# Patient Record
Sex: Male | Born: 1995
Health system: Southern US, Community
[De-identification: ages and names within clinical notes are randomized; demographics above are authoritative.]

## PROBLEM LIST (undated history)

## (undated) DIAGNOSIS — N200 Calculus of kidney: Secondary | ICD-10-CM

## (undated) DIAGNOSIS — K760 Fatty (change of) liver, not elsewhere classified: Secondary | ICD-10-CM

## (undated) DIAGNOSIS — J45909 Unspecified asthma, uncomplicated: Secondary | ICD-10-CM

## (undated) DIAGNOSIS — F329 Major depressive disorder, single episode, unspecified: Secondary | ICD-10-CM

## (undated) DIAGNOSIS — F419 Anxiety disorder, unspecified: Secondary | ICD-10-CM

## (undated) DIAGNOSIS — Z87442 Personal history of urinary calculi: Secondary | ICD-10-CM

## (undated) DIAGNOSIS — F32A Depression, unspecified: Secondary | ICD-10-CM

## (undated) DIAGNOSIS — F1010 Alcohol abuse, uncomplicated: Secondary | ICD-10-CM

## (undated) DIAGNOSIS — F603 Borderline personality disorder: Secondary | ICD-10-CM

## (undated) HISTORY — PX: WISDOM TOOTH EXTRACTION: SHX21

## (undated) HISTORY — PX: KIDNEY STONE SURGERY: SHX686

---
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
## 2016-01-13 DIAGNOSIS — J069 Acute upper respiratory infection, unspecified: Secondary | ICD-10-CM | POA: Diagnosis not present

## 2016-02-16 DIAGNOSIS — L700 Acne vulgaris: Secondary | ICD-10-CM | POA: Diagnosis not present

## 2016-02-16 DIAGNOSIS — L02214 Cutaneous abscess of groin: Secondary | ICD-10-CM | POA: Diagnosis not present

## 2016-04-05 DIAGNOSIS — F603 Borderline personality disorder: Secondary | ICD-10-CM | POA: Diagnosis not present

## 2016-04-05 DIAGNOSIS — F331 Major depressive disorder, recurrent, moderate: Secondary | ICD-10-CM | POA: Diagnosis not present

## 2016-04-18 DIAGNOSIS — F603 Borderline personality disorder: Secondary | ICD-10-CM | POA: Diagnosis not present

## 2016-05-02 DIAGNOSIS — F603 Borderline personality disorder: Secondary | ICD-10-CM | POA: Diagnosis not present

## 2016-05-09 DIAGNOSIS — F603 Borderline personality disorder: Secondary | ICD-10-CM | POA: Diagnosis not present

## 2016-05-16 DIAGNOSIS — F603 Borderline personality disorder: Secondary | ICD-10-CM | POA: Diagnosis not present

## 2016-05-31 DIAGNOSIS — F603 Borderline personality disorder: Secondary | ICD-10-CM | POA: Diagnosis not present

## 2016-06-06 DIAGNOSIS — F603 Borderline personality disorder: Secondary | ICD-10-CM | POA: Diagnosis not present

## 2016-06-13 DIAGNOSIS — F603 Borderline personality disorder: Secondary | ICD-10-CM | POA: Diagnosis not present

## 2016-06-20 DIAGNOSIS — H1033 Unspecified acute conjunctivitis, bilateral: Secondary | ICD-10-CM | POA: Diagnosis not present

## 2016-06-24 DIAGNOSIS — F329 Major depressive disorder, single episode, unspecified: Secondary | ICD-10-CM | POA: Diagnosis not present

## 2016-06-27 DIAGNOSIS — F329 Major depressive disorder, single episode, unspecified: Secondary | ICD-10-CM | POA: Diagnosis not present

## 2016-07-04 DIAGNOSIS — F329 Major depressive disorder, single episode, unspecified: Secondary | ICD-10-CM | POA: Diagnosis not present

## 2016-07-18 DIAGNOSIS — F329 Major depressive disorder, single episode, unspecified: Secondary | ICD-10-CM | POA: Diagnosis not present

## 2016-07-22 DIAGNOSIS — J029 Acute pharyngitis, unspecified: Secondary | ICD-10-CM | POA: Diagnosis not present

## 2016-07-22 DIAGNOSIS — Z209 Contact with and (suspected) exposure to unspecified communicable disease: Secondary | ICD-10-CM | POA: Diagnosis not present

## 2016-07-25 DIAGNOSIS — F329 Major depressive disorder, single episode, unspecified: Secondary | ICD-10-CM | POA: Diagnosis not present

## 2016-07-26 DIAGNOSIS — F329 Major depressive disorder, single episode, unspecified: Secondary | ICD-10-CM | POA: Diagnosis not present

## 2016-08-05 DIAGNOSIS — F329 Major depressive disorder, single episode, unspecified: Secondary | ICD-10-CM | POA: Diagnosis not present

## 2016-08-08 DIAGNOSIS — F329 Major depressive disorder, single episode, unspecified: Secondary | ICD-10-CM | POA: Diagnosis not present

## 2016-08-15 DIAGNOSIS — F329 Major depressive disorder, single episode, unspecified: Secondary | ICD-10-CM | POA: Diagnosis not present

## 2016-08-22 DIAGNOSIS — F329 Major depressive disorder, single episode, unspecified: Secondary | ICD-10-CM | POA: Diagnosis not present

## 2016-08-29 DIAGNOSIS — F329 Major depressive disorder, single episode, unspecified: Secondary | ICD-10-CM | POA: Diagnosis not present

## 2016-09-02 DIAGNOSIS — F329 Major depressive disorder, single episode, unspecified: Secondary | ICD-10-CM | POA: Diagnosis not present

## 2017-01-18 DIAGNOSIS — J069 Acute upper respiratory infection, unspecified: Secondary | ICD-10-CM | POA: Diagnosis not present

## 2017-02-06 DIAGNOSIS — J069 Acute upper respiratory infection, unspecified: Secondary | ICD-10-CM | POA: Diagnosis not present

## 2017-02-06 DIAGNOSIS — R05 Cough: Secondary | ICD-10-CM | POA: Diagnosis not present

## 2017-03-13 DIAGNOSIS — B349 Viral infection, unspecified: Secondary | ICD-10-CM | POA: Diagnosis not present

## 2017-03-13 DIAGNOSIS — R509 Fever, unspecified: Secondary | ICD-10-CM | POA: Diagnosis not present

## 2017-06-13 DIAGNOSIS — J0140 Acute pansinusitis, unspecified: Secondary | ICD-10-CM | POA: Diagnosis not present

## 2017-07-26 DIAGNOSIS — N201 Calculus of ureter: Secondary | ICD-10-CM | POA: Diagnosis not present

## 2017-07-26 DIAGNOSIS — N132 Hydronephrosis with renal and ureteral calculous obstruction: Secondary | ICD-10-CM | POA: Diagnosis not present

## 2017-07-26 DIAGNOSIS — R310 Gross hematuria: Secondary | ICD-10-CM | POA: Diagnosis not present

## 2017-07-28 ENCOUNTER — Other Ambulatory Visit: Payer: Self-pay

## 2017-07-28 ENCOUNTER — Emergency Department (HOSPITAL_BASED_OUTPATIENT_CLINIC_OR_DEPARTMENT_OTHER)
Admission: EM | Admit: 2017-07-28 | Discharge: 2017-07-28 | Disposition: A | Discharge status: Home / Self Care | Payer: BLUE CROSS/BLUE SHIELD | Attending: Emergency Medicine | Admitting: Emergency Medicine

## 2017-07-28 ENCOUNTER — Encounter (HOSPITAL_BASED_OUTPATIENT_CLINIC_OR_DEPARTMENT_OTHER): Payer: Self-pay | Admitting: Emergency Medicine

## 2017-07-28 ENCOUNTER — Emergency Department (HOSPITAL_BASED_OUTPATIENT_CLINIC_OR_DEPARTMENT_OTHER): Payer: BLUE CROSS/BLUE SHIELD

## 2017-07-28 DIAGNOSIS — J45909 Unspecified asthma, uncomplicated: Secondary | ICD-10-CM | POA: Diagnosis not present

## 2017-07-28 DIAGNOSIS — N23 Unspecified renal colic: Secondary | ICD-10-CM | POA: Diagnosis not present

## 2017-07-28 DIAGNOSIS — Z79899 Other long term (current) drug therapy: Secondary | ICD-10-CM | POA: Insufficient documentation

## 2017-07-28 DIAGNOSIS — Z87891 Personal history of nicotine dependence: Secondary | ICD-10-CM | POA: Diagnosis not present

## 2017-07-28 DIAGNOSIS — R109 Unspecified abdominal pain: Secondary | ICD-10-CM | POA: Diagnosis not present

## 2017-07-28 DIAGNOSIS — N202 Calculus of kidney with calculus of ureter: Secondary | ICD-10-CM | POA: Diagnosis not present

## 2017-07-28 DIAGNOSIS — Z87442 Personal history of urinary calculi: Secondary | ICD-10-CM | POA: Diagnosis not present

## 2017-07-28 LAB — BASIC METABOLIC PANEL
Anion gap: 12 (ref 5–15)
BUN: 8 mg/dL (ref 6–20)
CO2: 23 mmol/L (ref 22–32)
Calcium: 9.4 mg/dL (ref 8.9–10.3)
Chloride: 104 mmol/L (ref 98–111)
Creatinine, Ser: 1.07 mg/dL (ref 0.61–1.24)
GFR calc Af Amer: >60 mL/min (ref >60)
GFR calc non Af Amer: >60 mL/min (ref >60)
Glucose, Bld: 100 mg/dL — ABNORMAL HIGH (ref 70–99)
Potassium: 3.8 mmol/L (ref 3.5–5.1)
Sodium: 139 mmol/L (ref 135–145)

## 2017-07-28 LAB — URINALYSIS, MICROSCOPIC (REFLEX): RBC / HPF: >50 RBC/hpf (ref 0–5)

## 2017-07-28 LAB — CBC
HCT: 39.5 % (ref 39.0–52.0)
Hemoglobin: 13.6 g/dL (ref 13.0–17.0)
MCH: 30.4 pg (ref 26.0–34.0)
MCHC: 34.4 g/dL (ref 30.0–36.0)
MCV: 88.2 fL (ref 78.0–100.0)
Platelets: 283 10*3/uL (ref 150–400)
RBC: 4.48 MIL/uL (ref 4.22–5.81)
RDW: 12.3 % (ref 11.5–15.5)
WBC: 14.7 10*3/uL — ABNORMAL HIGH (ref 4.0–10.5)

## 2017-07-28 LAB — URINALYSIS, ROUTINE W REFLEX MICROSCOPIC
BILIRUBIN URINE: NEGATIVE
GLUCOSE, UA: NEGATIVE mg/dL
KETONES UR: NEGATIVE mg/dL
Leukocytes, UA: NEGATIVE
NITRITE: NEGATIVE
Protein, ur: 30 mg/dL — AB
SPECIFIC GRAVITY, URINE: 1.015 (ref 1.005–1.030)
pH: 8 (ref 5.0–8.0)

## 2017-07-28 MED ORDER — HYDROMORPHONE HCL 1 MG/ML IJ SOLN
1.0000 mg | Freq: Once | INTRAMUSCULAR | Status: AC
  Administered 2017-07-28: 1 mg via INTRAVENOUS
  Filled 2017-07-28: qty 1

## 2017-07-28 MED ORDER — KETOROLAC TROMETHAMINE 10 MG PO TABS: 10.0000 mg | ORAL_TABLET | Freq: Four times a day (QID) | ORAL | 0 refills | Status: AC | PRN

## 2017-07-28 MED ORDER — KETOROLAC TROMETHAMINE 30 MG/ML IJ SOLN
30.0000 mg | Freq: Once | INTRAMUSCULAR | Status: AC
  Administered 2017-07-28: 30 mg via INTRAVENOUS
  Filled 2017-07-28: qty 1

## 2017-07-28 MED ORDER — ONDANSETRON HCL 4 MG/2ML IJ SOLN
4.0000 mg | Freq: Once | INTRAMUSCULAR | Status: AC
  Administered 2017-07-28: 4 mg via INTRAVENOUS
  Filled 2017-07-28: qty 2

## 2017-07-28 MED ORDER — OXYCODONE HCL 10 MG PO TABS: 5.0000 mg | ORAL_TABLET | Freq: Four times a day (QID) | ORAL | 0 refills | Status: DC | PRN

## 2017-07-28 MED ORDER — HYDROMORPHONE HCL 4 MG PO TABS
4.0000 mg | ORAL_TABLET | Freq: Once | ORAL | Status: DC
  Filled 2017-07-28: qty 1

## 2017-07-28 MED ORDER — OXYCODONE HCL 5 MG PO TABS
10.0000 mg | ORAL_TABLET | Freq: Once | ORAL | Status: AC
  Administered 2017-07-28: 10 mg via ORAL
  Filled 2017-07-28: qty 2

## 2017-07-28 MED ORDER — ONDANSETRON HCL 4 MG PO TABS: 4.0000 mg | ORAL_TABLET | Freq: Three times a day (TID) | ORAL | 0 refills | Status: AC | PRN

## 2017-07-28 NOTE — ED Provider Notes (Signed)
[facility] EMERGENCY DEPARTMENT Provider Note   CSN: [id number] Arrival date & time: 07/28/17  1711     History   Chief Complaint Chief Complaint  Patient presents with  . Flank Pain    HPI Stanley Henry is a 22 y.o. male who presents the emergency department chief complaint of right flank pain.  He has a past medical history of recurrent kidney stones and was diagnosed with a right sided 4.5 x 2.5 mm kidney [place] in the proximal right ureter yesterday by his urologist.  He was discharged home with Percocet.  The patient states that his pain became severe again this morning around 11 and is radiating into his right groin.  He took his pain medication without relief.  He rates his pain at 10 out of 10.   Other is present at bedside and states that "we tried to tough it out together but he is miserable."  He denies fever or chills.  He has been able to urinate.  He denies hematuria.  He is scheduled for lithotripsy on Monday, 07/31/2017.  HPI  Past Medical History:  Diagnosis Date  . Anxiety   . Asthma   . Depression   . Fatty liver   . History of kidney stones   . Kidney [place]     Patient Active Problem List   Diagnosis Date Noted  . MDD (major depressive disorder), recurrent episode, moderate (HCC) 02/18/2013  . GAD (generalized anxiety disorder) 06/04/2012    Past Surgical History:  Procedure Laterality Date  . KIDNEY [place] SURGERY    . WISDOM TOOTH EXTRACTION          Home Medications    Prior to Admission medications   Medication Sig Start Date End Date Taking? Authorizing Provider  albuterol (PROVENTIL HFA;VENTOLIN HFA) 108 (90 BASE) MCG/ACT inhaler Inhale 2 puffs into the lungs every 6 (six) hours as needed for shortness of breath. Patient may resume home supply. 02/20/13   Winson, Louie Bun, NP  citalopram (CELEXA) 20 MG tablet Take 1 tablet (20 mg total) by mouth daily. 02/20/13   Winson, Louie Bun, NP  emtricitabine-tenofovir (TRUVADA) 200-300 MG tablet  Take 1 tablet by mouth daily.    [provider]  fexofenadine (ALLEGRA) 180 MG tablet Take 1 tablet (180 mg total) by mouth every morning. Patient may resume home supply. 02/20/13   Winson, Louie Bun, NP  ibuprofen (ADVIL,MOTRIN) 400 MG tablet Take 1 tablet (400 mg total) by mouth every 8 (eight) hours as needed. 12/01/15   Azalia Bilis, MD  ISOtretinoin (ACCUTANE) 30 MG capsule Take 30 mg by mouth 2 (two) times daily.    [provider]  lamoTRIgine (LAMICTAL) 100 MG tablet Take 1 tablet (100 mg total) by mouth daily. 02/20/13   Winson, Louie Bun, NP  ondansetron (ZOFRAN ODT) 8 MG disintegrating tablet Take 1 tablet (8 mg total) by mouth every 8 (eight) hours as needed for nausea or vomiting. 12/01/15   Azalia Bilis, MD  phenazopyridine (PYRIDIUM) 200 MG tablet Take 1 tablet (200 mg total) by mouth 3 (three) times daily as needed for pain. 12/10/15   Crist Fat, MD  risperiDONE (RISPERDAL) 0.5 MG tablet Take 1 tablet (0.5 mg total) by mouth 2 (two) times daily. 02/20/13   Winson, Louie Bun, NP  tamsulosin (FLOMAX) 0.4 MG CAPS capsule Take 1 capsule (0.4 mg total) by mouth daily. 12/01/15   Azalia Bilis, MD    Family History No family history on file.  Social History Social History   Tobacco Use  . Smoking status: Former Games developer  . Smokeless tobacco: Never Used  Substance Use Topics  . Alcohol use: No  . Drug use: Yes    Types: Marijuana    Comment: last used 12/09/2015     Allergies   Phenergan [promethazine hcl] and Hydrocodone   Review of Systems Review of Systems Ten systems reviewed and are negative for acute change, except as noted in the HPI.    Physical Exam Updated Vital Signs BP (!) 137/94 (BP Location: Left Arm)   Pulse 98   Temp 98.3 F (36.8 C) (Oral)   Resp 18   Ht 5\' 9"  (1.753 m)   Wt 97.5 kg (215 lb)   SpO2 100%   BMI 31.75 kg/m   Physical Exam  Constitutional: He appears well-developed and well-nourished. No distress.  Tearful,  squirming around in the bed, unable to find a position of comfort  HENT:  Head: Normocephalic and atraumatic.  Eyes: Conjunctivae are normal. No scleral icterus.  Neck: Normal range of motion. Neck supple.  Cardiovascular: Normal rate, regular rhythm and normal heart sounds.  Pulmonary/Chest: Effort normal and breath sounds normal. No respiratory distress.  Abdominal: Soft. He exhibits no distension. There is no tenderness.  Right sided CVA tenderness  Musculoskeletal: He exhibits no edema.  Neurological: He is alert.  Skin: Skin is warm and dry. He is not diaphoretic.  Psychiatric: His behavior is normal.  Nursing note and vitals reviewed.    ED Treatments / Results  Labs (all labs ordered are listed, but only abnormal results are displayed) Labs Reviewed  BASIC METABOLIC PANEL  CBC  URINALYSIS, ROUTINE W REFLEX MICROSCOPIC    EKG None  Radiology No results found.  Procedures Procedures (including critical care time)  Medications Ordered in ED Medications  HYDROmorphone (DILAUDID) injection 1 mg (has no administration in time range)  ondansetron (ZOFRAN) injection 4 mg (has no administration in time range)     Initial Impression / Assessment and Plan / ED Course  I have reviewed the triage vital signs and the nursing notes.  Pertinent labs & imaging results that were available during my care of the patient were reviewed by me and considered in my medical decision making (see chart for details).     This is a 22 year old male with a known kidney [place] who presented with intractable pain.  Ultrasound performed and I have reviewed the images.  He appears to have some worsening hydronephrosis on the right.  [name] lab work shows elevated white blood cell count with a believe is secondary to acute phase reaction in this stressful situation.  He has large hemoglobin without evidence of infection on his urinalysis.  There is no elevation in the patient's creatinine or  suggestion of post obstructive uropathy.  Eventually we were able to control the patient's pain with oral oxycodone, Toradol.  He is currently rating his pain at 1 out of 10 and feels comfortable being discharged.  I discussed that the patient should return to [facility] emergency department should he have intractable pain or vomiting, fever or other concerning symptom.  He may otherwise follow-up as scheduled this coming Wednesday for lithotripsy.  Patient appears appropriate for discharge at this time.  Change the patient's pain medication regimen to oxycodone IR.  This will give him greater regulation over the Tylenol usage.  Patient will also be given p.o. Toradol and Zofran.  Discussed appropriate use of the narcotic medication.  Final Clinical Impressions(s) / ED Diagnoses   Final diagnoses:  Ureteral colic    ED Discharge Orders    None       Arthor CaptainHarris, Gladyse Corvin, PA-C 07/29/17 0036    Gwyneth SproutPlunkett, Whitney, MD 07/30/17 Rickey [id number]

## 2017-07-28 NOTE — Discharge Instructions (Signed)
Contact a health care provider if: You have a fever or chills. Your urine smells bad or looks cloudy. You have pain or burning when you pass urine. Get help right away if: Your flank pain or groin pain suddenly worsens. You become confused or disoriented or you lose consciousness. 

## 2017-07-28 NOTE — ED Triage Notes (Signed)
Hematuria and R flank pain since Wednesday. Seen by his urologist and told he has a kidney [place]. scheduled for lithotripsy on Monday. Given oxycodone for pain which is not working.

## 2017-08-08 DIAGNOSIS — N201 Calculus of ureter: Secondary | ICD-10-CM | POA: Diagnosis not present

## 2017-08-09 DIAGNOSIS — F329 Major depressive disorder, single episode, unspecified: Secondary | ICD-10-CM | POA: Diagnosis not present

## 2017-08-15 DIAGNOSIS — F329 Major depressive disorder, single episode, unspecified: Secondary | ICD-10-CM | POA: Diagnosis not present

## 2017-08-17 DIAGNOSIS — L700 Acne vulgaris: Secondary | ICD-10-CM | POA: Diagnosis not present

## 2017-08-17 DIAGNOSIS — B9689 Other specified bacterial agents as the cause of diseases classified elsewhere: Secondary | ICD-10-CM | POA: Diagnosis not present

## 2017-08-17 DIAGNOSIS — L02821 Furuncle of head [any part, except face]: Secondary | ICD-10-CM | POA: Diagnosis not present

## 2017-08-23 DIAGNOSIS — F329 Major depressive disorder, single episode, unspecified: Secondary | ICD-10-CM | POA: Diagnosis not present

## 2017-08-29 DIAGNOSIS — F329 Major depressive disorder, single episode, unspecified: Secondary | ICD-10-CM | POA: Diagnosis not present

## 2017-09-20 DIAGNOSIS — F329 Major depressive disorder, single episode, unspecified: Secondary | ICD-10-CM | POA: Diagnosis not present

## 2017-09-27 DIAGNOSIS — F329 Major depressive disorder, single episode, unspecified: Secondary | ICD-10-CM | POA: Diagnosis not present

## 2017-10-25 ENCOUNTER — Ambulatory Visit: Payer: Self-pay | Admitting: Psychiatry

## 2017-11-01 ENCOUNTER — Ambulatory Visit (INDEPENDENT_AMBULATORY_CARE_PROVIDER_SITE_OTHER): Payer: BLUE CROSS/BLUE SHIELD | Admitting: Psychiatry

## 2017-11-01 DIAGNOSIS — F1010 Alcohol abuse, uncomplicated: Secondary | ICD-10-CM

## 2017-11-01 DIAGNOSIS — F1210 Cannabis abuse, uncomplicated: Secondary | ICD-10-CM

## 2017-11-01 DIAGNOSIS — F331 Major depressive disorder, recurrent, moderate: Secondary | ICD-10-CM

## 2017-11-01 DIAGNOSIS — F4310 Post-traumatic stress disorder, unspecified: Secondary | ICD-10-CM

## 2017-11-01 DIAGNOSIS — F603 Borderline personality disorder: Secondary | ICD-10-CM

## 2017-11-01 NOTE — Progress Notes (Signed)
[facility] Counselor/Therapist Progress Note   Patient ID: KIMBLE HITCHENS, MRN: [id number]  Date: 11/01/2017  Start time: 11:35a     Stop time: 12:25p Time Spent: 50 min  Treatment Type: Individual  Subjective:  Apologetic for no show.  Had new flareup of negativity with Rise Mu, he apologized, but  C/o forgetfulness, admittedly can be related to pot use, and anxiety, and trauma conditioning.  Monday had violent thoughts in response to frustrations in relationship, roommates lack of household responsibilities, used THC and EtOH, vented at [place], was pissed at his client, and had violent fantasies toward self and others without intent or plan.  Irritated that Rise Mu "broke our rules" about sexual boundaries in open "play" with others, and   Still irritated with client Oswaldo Done, who manipulated him again into doing his homework for him.  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  Has a monthly music show now, succeeding better, but envious of the money Rise Mu makes doing several drag shows a week, and feeling some unfairness in their domestic relationship.  Interventions: Dialectical Behavioral Therapy, Assertiveness/Communication and Reality Therapy  Reframed communication and conflict habits of blaming, personalizing, quibbling, and other reacting as the core problems, more than personalities.  Interpreted needs to have collaborative control over reactionary process, establish fair fighting standards, and put more agreed-upon effort into problem-solving, "customer service", and a "how I can help [place] right now" orientation.  Recommended "do-overs" as a key change in the flow of, and response to, arguments and "I'm angry, and this is when I want to take it out on you" as a way to speak for his feelings rather than go directly to taking them out, continuing the cycle of verbal abuse.  Gave options for subsidized individual therapy, should Rise Mu want them, and  framed best-chances way to offer them if PT still wants to.  Agreed Kalin welcome in couple therapy should PT choose.  Mental Status Exam:    Appearance:   Casual and Fairly Groomed     Behavior:  Sharing and Blaming  Motor:  Normal  Speech/Language:   Clear and Coherent and Pressured  Affect:  Irritated, nonlabile  Mood:  irritable  Thought process:  normal and some flight of ideas  Thought content:    WNL and some overvalued ideas  Sensory/Perceptual disturbances:    WNL  Orientation:  WNL  Attention:  Good  Concentration:  Fair  Memory:  WNL  Fund of knowledge:   Good  Insight:    Good  Judgment:   Fair  Impulse Control:  Fair     Risk Assessment: Danger to Self:  No Self-injurious Behavior: No Danger to Others: No Duty to Warn:no Physical Aggression / Violence:No  Access to Firearms a concern: No  Gang Involvement:No    Diagnosis:   ICD-10-CM   1. MDD (major depressive disorder), recurrent episode, moderate (HCC) F33.1   2. PTSD (post-traumatic stress disorder) F43.10   3. Marijuana abuse, continuous F12.10   4. Alcohol abuse F10.10   5. Borderline personality disorder in adult [facility]) F60.3     Plan:  . Use communication and conflict resolution tips as able . OK to incorporate couple therapy at discretion . Make sure of financial backing for therapy services if still the understanding with parents . Continue to utilize previously learned skills ad lib . Maintain medication, if prescribed, and work faithfully with relevant prescriber(s) . Call the clinic on-call service, present  to ER, or call 911 if any life-threatening emergency . Follow up with me in about 2 weeks    Robley Fries, PhD

## 2017-11-06 DIAGNOSIS — R05 Cough: Secondary | ICD-10-CM | POA: Diagnosis not present

## 2017-11-08 ENCOUNTER — Encounter: Payer: Self-pay | Admitting: Psychiatry

## 2017-11-08 ENCOUNTER — Ambulatory Visit: Payer: Self-pay | Admitting: Psychiatry

## 2017-11-08 NOTE — Progress Notes (Addendum)
Patient ID: Stanley Henry, male   DOB: 08/24/1995, 22 y.o.   MRN: [id number]  No show for 10:00 AM appointment.  He is already being charged for no-show two appointments ago without sufficient justification, has some history of impulsively neglecting appointments while irritated, disaffected/resentful over life circumstances, or recovering from drug use.    Called 9:48am, per message from staff received 1pm, stating he is sick, will bring doctor's note next session.

## 2017-11-12 ENCOUNTER — Emergency Department (HOSPITAL_BASED_OUTPATIENT_CLINIC_OR_DEPARTMENT_OTHER)
Admission: EM | Admit: 2017-11-12 | Discharge: 2017-11-12 | Disposition: A | Discharge status: Home / Self Care | Payer: BLUE CROSS/BLUE SHIELD | Attending: Emergency Medicine | Admitting: Emergency Medicine

## 2017-11-12 ENCOUNTER — Other Ambulatory Visit: Payer: Self-pay

## 2017-11-12 ENCOUNTER — Encounter (HOSPITAL_BASED_OUTPATIENT_CLINIC_OR_DEPARTMENT_OTHER): Payer: Self-pay | Admitting: Emergency Medicine

## 2017-11-12 DIAGNOSIS — R519 Headache, unspecified: Secondary | ICD-10-CM

## 2017-11-12 DIAGNOSIS — R0981 Nasal congestion: Secondary | ICD-10-CM | POA: Insufficient documentation

## 2017-11-12 DIAGNOSIS — F17210 Nicotine dependence, cigarettes, uncomplicated: Secondary | ICD-10-CM | POA: Insufficient documentation

## 2017-11-12 DIAGNOSIS — F1210 Cannabis abuse, uncomplicated: Secondary | ICD-10-CM | POA: Diagnosis not present

## 2017-11-12 DIAGNOSIS — J45909 Unspecified asthma, uncomplicated: Secondary | ICD-10-CM | POA: Insufficient documentation

## 2017-11-12 DIAGNOSIS — J069 Acute upper respiratory infection, unspecified: Secondary | ICD-10-CM

## 2017-11-12 DIAGNOSIS — Z79899 Other long term (current) drug therapy: Secondary | ICD-10-CM | POA: Diagnosis not present

## 2017-11-12 DIAGNOSIS — R51 Headache: Secondary | ICD-10-CM | POA: Diagnosis not present

## 2017-11-12 DIAGNOSIS — B9789 Other viral agents as the cause of diseases classified elsewhere: Secondary | ICD-10-CM | POA: Diagnosis not present

## 2017-11-12 NOTE — ED Provider Notes (Signed)
[facility] EMERGENCY DEPARTMENT Provider Note   CSN: [id number] Arrival date & time: 11/12/17  1354     History   Chief Complaint Chief Complaint  Patient presents with  . Headache    HPI Stanley Henry is a 22 y.o. male presenting to the emergency department with complaint of frontal headache described as a pressure that began on Friday.  Patient states he had a cold was diagnosed by [facility] urgent care on Monday of last week.  States cold symptoms insisted of a cough with runny nose and intermittent fever.  States his cold symptoms have been improving, however on Friday he developed a frontal headache described as pressure.  He has had some increase in nasal congestion since that time as well.  He is tried multiple over-the-counter medications including NSAIDs, Tylenol, and migraine medications for headache with some improvement.  States headache has been constant, is worse with coughing and bending forward.  States his cough is improving since last week, however he still has some greenish phlegm.  He denies vision changes, nausea or vomiting, shortness of breath, ear pain.  The history is provided by the patient.    Past Medical History:  Diagnosis Date  . Anxiety   . Asthma   . Depression   . Fatty liver   . History of kidney stones   . Kidney [place]     Patient Active Problem List   Diagnosis Date Noted  . MDD (major depressive disorder), recurrent episode, moderate (HCC) 02/18/2013  . GAD (generalized anxiety disorder) 06/04/2012    Past Surgical History:  Procedure Laterality Date  . KIDNEY [place] SURGERY    . WISDOM TOOTH EXTRACTION          Home Medications    Prior to Admission medications   Medication Sig Start Date End Date Taking? Authorizing Provider  albuterol (PROVENTIL HFA;VENTOLIN HFA) 108 (90 BASE) MCG/ACT inhaler Inhale 2 puffs into the lungs every 6 (six) hours as needed for shortness of breath. Patient may resume home supply. 02/20/13    Winson, Louie Bun, NP  citalopram (CELEXA) 20 MG tablet Take 1 tablet (20 mg total) by mouth daily. 02/20/13   Winson, Louie Bun, NP  emtricitabine-tenofovir (TRUVADA) 200-300 MG tablet Take 1 tablet by mouth daily.    [provider]  fexofenadine (ALLEGRA) 180 MG tablet Take 1 tablet (180 mg total) by mouth every morning. Patient may resume home supply. 02/20/13   Winson, Louie Bun, NP  ibuprofen (ADVIL,MOTRIN) 400 MG tablet Take 1 tablet (400 mg total) by mouth every 8 (eight) hours as needed. 12/01/15   Azalia Bilis, MD  ISOtretinoin (ACCUTANE) 30 MG capsule Take 30 mg by mouth 2 (two) times daily.    [provider]  ketorolac (TORADOL) 10 MG tablet Take 1 tablet (10 mg total) by mouth every 6 (six) hours as needed. 07/28/17   Arthor Captain, PA-C  lamoTRIgine (LAMICTAL) 100 MG tablet Take 1 tablet (100 mg total) by mouth daily. 02/20/13   Winson, Louie Bun, NP  ondansetron (ZOFRAN ODT) 8 MG disintegrating tablet Take 1 tablet (8 mg total) by mouth every 8 (eight) hours as needed for nausea or vomiting. 12/01/15   Azalia Bilis, MD  ondansetron (ZOFRAN) 4 MG tablet Take 1 tablet (4 mg total) by mouth every 8 (eight) hours as needed for nausea or vomiting. 07/28/17   Arthor Captain, PA-C  Oxycodone HCl 10 MG TABS Take 0.5-1 tablets (5-10 mg total) by mouth every 6 (six) hours  as needed for severe pain. 07/28/17   Arthor Captain, PA-C  phenazopyridine (PYRIDIUM) 200 MG tablet Take 1 tablet (200 mg total) by mouth 3 (three) times daily as needed for pain. 12/10/15   Crist Fat, MD  risperiDONE (RISPERDAL) 0.5 MG tablet Take 1 tablet (0.5 mg total) by mouth 2 (two) times daily. 02/20/13   Winson, Louie Bun, NP  tamsulosin (FLOMAX) 0.4 MG CAPS capsule Take 1 capsule (0.4 mg total) by mouth daily. 12/01/15   Azalia Bilis, MD    Family History No family history on file.  Social History Social History   Tobacco Use  . Smoking status: Current Some Day Smoker    Types: Cigarettes  . Smokeless  tobacco: Never Used  Substance Use Topics  . Alcohol use: No  . Drug use: Yes    Types: Marijuana     Allergies   Phenergan [promethazine hcl] and Hydrocodone   Review of Systems Review of Systems  Constitutional: Negative for fever.  HENT: Positive for congestion, postnasal drip and rhinorrhea. Negative for ear pain, sore throat, trouble swallowing and voice change.   Eyes: Negative for photophobia and visual disturbance.  Respiratory: Positive for cough. Negative for shortness of breath and wheezing.   Gastrointestinal: Negative for nausea and vomiting.  Neurological: Positive for headaches.     Physical Exam Updated Vital Signs BP (!) 148/95 (BP Location: Right Arm)   Pulse 78   Temp 98.7 F (37.1 C) (Oral)   Resp 18   Ht 5\' 8"  (1.727 m)   Wt 97.5 kg   SpO2 99%   BMI 32.69 kg/m   Physical Exam  Constitutional: He is oriented to person, place, and time. He appears well-developed and well-nourished.  Non-toxic appearance. He does not appear ill. No distress.  HENT:  Head: Normocephalic and atraumatic.  Mouth/Throat: Uvula is midline and oropharynx is clear and moist.  Mild tenderness to frontal sinus.  No tenderness to maxillary sinuses.TMs are normal.  Eyes: Pupils are equal, round, and reactive to light. Conjunctivae and EOM are normal.  Neck: Normal range of motion. Neck supple.  Cardiovascular: Normal rate, regular rhythm and normal heart sounds.  Pulmonary/Chest: Effort normal and breath sounds normal. No respiratory distress.  Lymphadenopathy:    He has no cervical adenopathy.  Neurological: He is alert and oriented to person, place, and time.  Mental Status:  Alert, oriented, thought content appropriate, able to give a coherent history. Speech fluent without evidence of aphasia. Able to follow 2 step commands without difficulty.  Cranial Nerves:  II:  Peripheral visual fields grossly normal, pupils equal, round, reactive to light III,IV, VI: ptosis not  present, extra-ocular motions intact bilaterally  V,VII: smile symmetric, facial light touch sensation equal VIII: hearing grossly normal to voice  X: uvula elevates symmetrically  XI: bilateral shoulder shrug symmetric and strong XII: midline tongue extension without fassiculations Motor:  Normal tone. 5/5 in upper and lower extremities bilaterally including strong and equal grip strength and dorsiflexion/plantar flexion Cerebellar: normal finger-to-nose with bilateral upper extremities    Psychiatric: He has a normal mood and affect. His behavior is normal.  Nursing note and vitals reviewed.    ED Treatments / Results  Labs (all labs ordered are listed, but only abnormal results are displayed) Labs Reviewed - No data to display  EKG None  Radiology No results found.  Procedures Procedures (including critical care time)  Medications Ordered in ED Medications - No data to display   Initial Impression /  Assessment and Plan / ED Course  I have reviewed the triage vital signs and the nursing notes.  Pertinent labs & imaging results that were available during my care of the patient were reviewed by me and considered in my medical decision making (see chart for details).  Clinical Course as of Nov 12 1449  Wynelle Link Nov 12, 2017  1423 Cold Monday, productive cough, runny nose. Friday headache , frontal, pressure. Congestion. Cough improved though still productive.  No vision changes, no N/V.    [JR]    Clinical Course User Index [JR] Aries Kasa, [place] N, PA-C    Patient with symptoms consistent with sinus headache.  Symptoms began on Friday, and per guidelines antibiotics are not indicated at this time for sinusitis.  Pt is afebrile with normal vital signs.  Nondistressed.   Mild frontal sinus tenderness, ENT exam is otherwise unremarkable .  Lungs are clear with normal work of breathing.  Offered chest x-ray given greenish sputum, however patient states cough is improving and  declined at this time.  Believe this is reasonable, low suspicion for pneumonia given improving symptoms and lack of fever. Discussed symptomatic management of viral URI with sinus headache. Recommend PCP follow up if symptoms persist for 10 days without improvement. Safe for discharge.  Discussed results, findings, treatment and follow up. Patient advised of return precautions. Patient verbalized understanding and agreed with plan.   Final Clinical Impressions(s) / ED Diagnoses   Final diagnoses:  Sinus headache  Viral URI    ED Discharge Orders    None       Arvie Villarruel, [place] N, PA-C 11/12/17 1459    Pricilla Loveless, MD 11/12/17 1515

## 2017-11-12 NOTE — Discharge Instructions (Signed)
Please read instructions below.  You can take tylenol or ibuprofen as needed for headache.  Drink plenty of water.  Use flonase or saline nasal spray for congestion. You can also take an antihistamine or decongestant to help with sinus pressure.  Follow up with your primary care provider if symptoms persist and don't improve after 10 days. Return to the ER for inability to swallow liquids, difficulty breathing, or new or worsening symptoms.

## 2017-11-12 NOTE — ED Triage Notes (Signed)
H/A and facial pain x 2 days

## 2017-11-15 ENCOUNTER — Ambulatory Visit (INDEPENDENT_AMBULATORY_CARE_PROVIDER_SITE_OTHER): Payer: BLUE CROSS/BLUE SHIELD | Admitting: Psychiatry

## 2017-11-15 DIAGNOSIS — F603 Borderline personality disorder: Secondary | ICD-10-CM | POA: Diagnosis not present

## 2017-11-15 DIAGNOSIS — F1210 Cannabis abuse, uncomplicated: Secondary | ICD-10-CM

## 2017-11-15 DIAGNOSIS — F331 Major depressive disorder, recurrent, moderate: Secondary | ICD-10-CM | POA: Diagnosis not present

## 2017-11-15 DIAGNOSIS — F1010 Alcohol abuse, uncomplicated: Secondary | ICD-10-CM

## 2017-11-15 NOTE — Progress Notes (Signed)
[facility] Counselor/Therapist Progress Note   Patient ID: Stanley Henry A Raider MRN: [id number] Date: 11/15/2017 Treatment Type: Individual Start time: 10:20a     Stop time: 11:10a Time Spent: 50 min  Subjective: Irritated by sinus infection, helped by medication, but prolonged.  Ocularly irritated with what he feels was the run around between insurance nurse telling him to go to the ED in the ED saying there was nothing they could do for him but it would cost him $300.  On a "beef" with another performer name Jonny RuizJohn, seems to be obsessed with it, from the sound of self-report and from what friends and roommates are telling him.  Irritated by experience of other people telling him what he should talk about in therapy.  Alleges he has apologized to the guy, says he has tried to own his own "problematic traits", though his narrative sounds as much like he was trying to get Jonny RuizJohn and others to give him leeway to react as he sees fit rather than fully, humbly to make peace.  Admits on confrontation that he relishes the fight, wants to feel it, and is not certain whether anything would satisfy him.  Has released a "diss" track about Jonny RuizJohn and reports having same thing done about him, only he finds it laughable and some confirmation that he is making it in his performance field.  Renewed irritation with roommates for what he regards as bitchy, double standard conversations about responsibilities, finances, and subtle ground rules for interacting.  One roommate in particular cites PT hurting his own business when he kicked out John from the apartment, while PT cites roommate fraternizing with the enemy, not picking up after himself, and not fulfilling the spirit of their roommate agreement by collaborating more on music.  Repeatedly uses the term "my boyfriend" to refer to [place]alin in session, appreciates his personal support in feud with Jonny RuizJohn, but takes him to task as well for "violating our agreement" about sex play  with other people, which he claims as justification for cheating recently.  Irving Burtonmily, complains of him reacting when PT expresses irritation or is otherwise harsh to him temporarily.  Meanwhile, reports as many as 3 times a week lapsing into obsessive self-hatred and suicidal thoughts without really specifying any object of self hatred acknowledges on further reflection that he has more violent thoughts directed outward than inward.  Admits poly-addiction to shopping, sex, music, drugs of several kinds, and conflict itself.  Admits he is too impulsive to save money wants to see if he can get his parents to cover his no-show fee here.  Interventions: Cognitive Behavioral Therapy, Dialectical Behavioral Therapy, Assertiveness/Communication and direct advice Supportive confrontation of double standards for other people, differentiating generalizable abuses from exaggerations meant to justify reacting.  Led to admit honestly and bluntly when it is actually the fight he wants, or an escape from responsibility, etc., promoting the principles of radical honesty in sessions and respect for base motives, like any other motives.  Characterized his "addiction" as primarily getting a quick fix feel better, whatever form that might take, and his challenged to delay the pay off a little bit to see if a cleaner tactic can work with others.  Supportively confronted inflated expectations of others to be mature and compassionate and advocated setting an expectation with BF had a nonconfrontational time to be alert to defensive speech as a disguised cry for help.  Modeled several versions the request.  Agrees, will try.  Mental Status Exam:  Appearance:   Casual     Behavior:  Rationalizing, Blaming and Evasive  Motor:  Normal  Speech/Language:   Clear and Coherent  Affect:  Congruent and irritated, smiling appropr to content  Mood:  irritable  Thought process:  normal  Thought content:    Rumination and resentment   Sensory/Perceptual disturbances:    WNL  Orientation:  WNL  Attention:  Fair  Concentration:  Good  Memory:  WNL  Fund of knowledge:   Good  Insight:    Fair  Judgment:   Fair  Impulse Control:  Poor     Risk Assessment: Danger to Self:  Yes.  without intent/plan Self-injurious Behavior: No Danger to Others: Yes.  without intent/plan Duty to Warn:no Physical Aggression / Violence:No  Access to Firearms a concern: No  Gang Involvement:No   Diagnosis:   ICD-10-CM   1. MDD (major depressive disorder), recurrent episode, moderate (HCC) F33.1   2. Borderline personality disorder in adult [facility]) F60.3   3. Marijuana abuse, continuous F12.10   4. Alcohol abuse F10.10    Plan:  . Try preplanning constructive reactions with BF . Continue to utilize previously learned skills ad lib . Maintain medication, if prescribed, and work faithfully with relevant prescriber(s) . Call the clinic on-call service, present to ER, or call 911 if any life-threatening emergency . Follow up with me in about 1-2 weeks if possible, 3 at the latest    Robley Fries, PhD

## 2017-11-22 ENCOUNTER — Ambulatory Visit (INDEPENDENT_AMBULATORY_CARE_PROVIDER_SITE_OTHER): Payer: BLUE CROSS/BLUE SHIELD | Admitting: Psychiatry

## 2017-11-22 DIAGNOSIS — F331 Major depressive disorder, recurrent, moderate: Secondary | ICD-10-CM

## 2017-11-22 DIAGNOSIS — F1210 Cannabis abuse, uncomplicated: Secondary | ICD-10-CM | POA: Diagnosis not present

## 2017-11-22 DIAGNOSIS — F1010 Alcohol abuse, uncomplicated: Secondary | ICD-10-CM

## 2017-11-22 DIAGNOSIS — F603 Borderline personality disorder: Secondary | ICD-10-CM

## 2017-11-22 NOTE — Progress Notes (Signed)
Psychotherapy Progress Note -- Marliss CzarAndy Vaughn Frieze, PhD, [facility]  Patient ID: Stanley RedBryce A Henry     MRN: [id number]     Date: 11/22/2017     Treatment Type: Individual psychotherapy Start: 10:28a Stop: 11:15a Time Spent: 47 min Accompanied by: none  Self-Report (interim history, self-report of stressors and symptoms, application of prior therapy, status changes) Arrived 520' late, casual manner.  Dysphoric as usual.  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  Did not bring doctor's note for absence as pledged.  Had one episode of "spiraling" this week with storm of negative thoughts.  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  Preoccupied with social/arts "enemies", wants to do "them" (one rival artist in the gay and/or drag community, along with his social media supporters) social-media harm, e.g., by relaasing a "diss" track, trying to "cancel" his reputation through rumor and innuendo.  Generally content to let the virtual "war" happen and hopefully reap the benefits of negative attention.  Perceiving neighbor a anti-gay.  Says BF Stanley Henry broke their agreement about sex play with others (unspecified, implication he resumed nagging about fidelity?) so PT engaged others.  Still dislikes roommates, feels justified resenting them.  Considering restarting his education if he can get money for it.  History of Early College, graduated at [employer]17.  Got core classes finished at [employer].  Thinking he'd like to go into "consulting" because it's good money.  Therapies used: Cognitive Behavioral Therapy and Motivational Interviewing  Intervention notes: Standing firm that no-show will be forgiven after seeing doctor's note evidence.  Clarified values re. 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  Acknowledged he likes the fight, wants the fight.  Attempted to look at practicalities for conflict in the house.  Challenged specifics of the idea to resume education -- no specifics, no plan, just the hope of making and enjoying money.  Mental Status/Observations:     Appearance:   Casual and Fairly Groomed     Behavior:  Monopolizing and Minimizing  Motor:  Normal  Speech/Language:   Clear and Coherent and impressionistic/vague re. subject  Affect:  irritated, with subject, disdainful  Mood:  dysthymic and irritable  Thought process:  tangential  Thought content:    preoccupied with slights  Sensory/Perceptual disturbances:    WNL  Orientation:  WNL  Attention:  Good  Concentration:  Fair  Memory:  intact  Fund of knowledge:   Good  Insight:    Good  Judgment:   Fair  Impulse Control:  Poor   Risk Assessment: Danger to Self:  chronic, intermittent SI w/o plan/intent Self-injurious Behavior: only per risks of sexual and drug acting-out Danger to Others: No Duty to Warn:no Physical Aggression / Violence:No  Access to Firearms a concern: No   Diagnosis:   ICD-10-CM   1. Borderline personality disorder in adult [facility](HCC) F60.3   2. MDD (major depressive disorder), recurrent episode, moderate (HCC) F33.1   3. Marijuana abuse, continuous F12.10   4. Alcohol abuse F10.10     Progress rating:  worsening  Plan:  . Find a settling point with BF and roommates . Reduce pot use, bank the  savings . Focus on constructive actions for the future -- develop music involvements and move to [place] in the spring or work out a plan how to pay for education an what direction to head . Continue to utilize previously learned skills ad lib . Maintain medication, if prescribed, and work faithfully with relevant prescriber(s) . Call  the clinic on-call service, present to ER, or call 911 if any life-threatening emergency . Follow up with me in about 2 weeks  Robley Fries, PhD

## 2017-11-29 ENCOUNTER — Ambulatory Visit: Payer: Self-pay | Admitting: Psychiatry

## 2017-12-06 ENCOUNTER — Encounter: Payer: Self-pay | Admitting: Psychiatry

## 2017-12-06 ENCOUNTER — Ambulatory Visit: Payer: Self-pay | Admitting: Psychiatry

## 2017-12-06 NOTE — Progress Notes (Signed)
Patient ID: Stanley Henry, male   DOB: 09/26/1995, 22 y.o.   MRN: [id number]  12/06/17  Noshowed again for midmorming appointment without notice, has not produced doctor's note for noshow from a couple weeks back.  May keep next week's appointment in view of potential for crisis control, but request staff cancel further appointments to prevent waste, debt, and further degradation of working relationship.

## 2017-12-06 NOTE — Progress Notes (Signed)
Patient ID: Stanley Henry, male   DOB: 07/20/1995, 22 y.o.   MRN: [id number]   Addendum 12/06/17  On further consideration, no-show pattern is too chronic, and a fellow PT is asking for time before Christmas.  Revised instruction to cancel all remaining appointments, allow reschedule on request, and offer the opening next week to fellow PT.

## 2017-12-13 ENCOUNTER — Ambulatory Visit: Payer: BLUE CROSS/BLUE SHIELD | Admitting: Psychiatry

## 2019-02-08 ENCOUNTER — Emergency Department (HOSPITAL_BASED_OUTPATIENT_CLINIC_OR_DEPARTMENT_OTHER): Payer: 59

## 2019-02-08 ENCOUNTER — Encounter (HOSPITAL_BASED_OUTPATIENT_CLINIC_OR_DEPARTMENT_OTHER): Payer: Self-pay | Admitting: Emergency Medicine

## 2019-02-08 ENCOUNTER — Emergency Department (HOSPITAL_BASED_OUTPATIENT_CLINIC_OR_DEPARTMENT_OTHER)
Admission: EM | Admit: 2019-02-08 | Discharge: 2019-02-08 | Disposition: A | Discharge status: Home / Self Care | Payer: 59 | Attending: Emergency Medicine | Admitting: Emergency Medicine

## 2019-02-08 ENCOUNTER — Other Ambulatory Visit: Payer: Self-pay

## 2019-02-08 DIAGNOSIS — Z79899 Other long term (current) drug therapy: Secondary | ICD-10-CM | POA: Insufficient documentation

## 2019-02-08 DIAGNOSIS — F17210 Nicotine dependence, cigarettes, uncomplicated: Secondary | ICD-10-CM | POA: Insufficient documentation

## 2019-02-08 DIAGNOSIS — J45909 Unspecified asthma, uncomplicated: Secondary | ICD-10-CM | POA: Insufficient documentation

## 2019-02-08 DIAGNOSIS — R1013 Epigastric pain: Secondary | ICD-10-CM | POA: Diagnosis present

## 2019-02-08 DIAGNOSIS — K219 Gastro-esophageal reflux disease without esophagitis: Secondary | ICD-10-CM

## 2019-02-08 HISTORY — DX: Borderline personality disorder: F60.3

## 2019-02-08 HISTORY — DX: Alcohol abuse, uncomplicated: F10.10

## 2019-02-08 MED ORDER — ALUM & MAG HYDROXIDE-SIMETH 200-200-20 MG/5ML PO SUSP
30.0000 mL | Freq: Once | ORAL | Status: AC
  Administered 2019-02-08: 30 mL via ORAL
  Filled 2019-02-08: qty 30

## 2019-02-08 MED ORDER — LIDOCAINE VISCOUS HCL 2 % MT SOLN
15.0000 mL | Freq: Once | OROMUCOSAL | Status: AC
  Administered 2019-02-08: 15 mL via ORAL
  Filled 2019-02-08: qty 15

## 2019-02-08 MED ORDER — OMEPRAZOLE 20 MG PO CPDR: 20.0000 mg | DELAYED_RELEASE_CAPSULE | Freq: Every day | ORAL | 0 refills | Status: DC

## 2019-02-08 MED ORDER — SUCRALFATE 1 GM/10ML PO SUSP
1.0000 g | Freq: Three times a day (TID) | ORAL | Status: DC
  Administered 2019-02-08: 06:00:00 1 g via ORAL
  Filled 2019-02-08: qty 10

## 2019-02-08 NOTE — ED Provider Notes (Addendum)
[facility] EMERGENCY DEPARTMENT Provider Note   CSN: [id number] Arrival date & time: 02/08/19  0424     History Chief Complaint  Patient presents with  . Abdominal Pain  . Gastroesophageal Reflux    Stanley Henry is a 24 y.o. male.   Abdominal Pain Pain location:  Epigastric Pain quality: burning   Pain severity:  Severe Onset quality:  Gradual Duration:  10 days Timing:  Constant Progression:  Worsening Chronicity:  New Context: eating   Context: not diet changes   Relieved by:  Nothing Worsened by:  Nothing Ineffective treatments:  None tried Associated symptoms: no anorexia, no belching, no chest pain, no chills, no constipation, no cough, no diarrhea, no dysuria, no fatigue, no fever, no flatus, no hematemesis, no hematochezia, no hematuria, no melena, no nausea, no shortness of breath, no sore throat, no vaginal bleeding, no vaginal discharge and no vomiting   Risk factors: no NSAID use   Gastroesophageal Reflux This is a new problem. The current episode started more than 1 week ago. The problem occurs constantly. The problem has been gradually worsening. Associated symptoms include abdominal pain. Pertinent negatives include no chest pain and no shortness of breath. Nothing aggravates the symptoms. Nothing relieves the symptoms. Treatments tried: pepcid, he was diagnosed by PMD. The treatment provided no relief.       Past Medical History:  Diagnosis Date  . Alcohol abuse   . Anxiety   . Asthma   . Borderline personality disorder (HCC)   . Depression   . Fatty liver   . History of kidney stones   . Kidney Gaba     Patient Active Problem List   Diagnosis Date Noted  . MDD (major depressive disorder), recurrent episode, moderate (HCC) 02/18/2013  . GAD (generalized anxiety disorder) 06/04/2012    Past Surgical History:  Procedure Laterality Date  . KIDNEY N[place] SURGERY    . WISDOM TOOTH EXTRACTION         History reviewed. No pertinent  family history.  Social History   Tobacco Use  . Smoking status: Current Some Day Smoker    Types: Cigarettes  . Smokeless tobacco: Never Used  Substance Use Topics  . Alcohol use: Yes  . Drug use: Yes    Types: Marijuana    Home Medications Prior to Admission medications   Medication Sig Start Date End Date Taking? Authorizing Provider  albuterol (PROVENTIL HFA;VENTOLIN HFA) 108 (90 BASE) MCG/ACT inhaler Inhale 2 puffs into the lungs every 6 (six) hours as needed for shortness of breath. Patient may resume home supply. 02/20/13   Winson, Louie Bun, NP  citalopram (CELEXA) 20 MG tablet Take 1 tablet (20 mg total) by mouth daily. 02/20/13   Winson, Louie Bun, NP  emtricitabine-tenofovir (TRUVADA) 200-300 MG tablet Take 1 tablet by mouth daily.    [provider]  fexofenadine (ALLEGRA) 180 MG tablet Take 1 tablet (180 mg total) by mouth every morning. Patient may resume home supply. 02/20/13   Winson, Louie Bun, NP  ibuprofen (ADVIL,MOTRIN) 400 MG tablet Take 1 tablet (400 mg total) by mouth every 8 (eight) hours as needed. 12/01/15   Azalia Bilis, MD  ISOtretinoin (ACCUTANE) 30 MG capsule Take 30 mg by mouth 2 (two) times daily.    [provider]  ketorolac (TORADOL) 10 MG tablet Take 1 tablet (10 mg total) by mouth every 6 (six) hours as needed. 07/28/17   Arthor Captain, PA-C  lamoTRIgine (LAMICTAL) 100 MG tablet Take 1  tablet (100 mg total) by mouth daily. 02/20/13   Winson, Louie Bun, NP  omeprazole (PRILOSEC) 20 MG capsule Take 1 capsule (20 mg total) by mouth daily. 02/08/19   Jessabelle Markiewicz, MD  ondansetron (ZOFRAN ODT) 8 MG disintegrating tablet Take 1 tablet (8 mg total) by mouth every 8 (eight) hours as needed for nausea or vomiting. 12/01/15   Azalia Bilis, MD  ondansetron (ZOFRAN) 4 MG tablet Take 1 tablet (4 mg total) by mouth every 8 (eight) hours as needed for nausea or vomiting. 07/28/17   Arthor Captain, PA-C  Oxycodone HCl 10 MG TABS Take 0.5-1 tablets (5-10 mg total) by  mouth every 6 (six) hours as needed for severe pain. 07/28/17   Arthor Captain, PA-C  phenazopyridine (PYRIDIUM) 200 MG tablet Take 1 tablet (200 mg total) by mouth 3 (three) times daily as needed for pain. 12/10/15   Crist Fat, MD  risperiDONE (RISPERDAL) 0.5 MG tablet Take 1 tablet (0.5 mg total) by mouth 2 (two) times daily. 02/20/13   Winson, Louie Bun, NP  tamsulosin (FLOMAX) 0.4 MG CAPS capsule Take 1 capsule (0.4 mg total) by mouth daily. 12/01/15   Azalia Bilis, MD    Allergies    Phenergan [promethazine hcl] and Hydrocodone  Review of Systems   Review of Systems  Constitutional: Negative for chills, fatigue and fever.  HENT: Negative for sore throat.   Eyes: Negative for visual disturbance.  Respiratory: Negative for cough and shortness of breath.   Cardiovascular: Negative for chest pain.  Gastrointestinal: Positive for abdominal pain. Negative for anorexia, constipation, diarrhea, flatus, hematemesis, hematochezia, melena, nausea and vomiting.  Genitourinary: Negative for dysuria, hematuria, vaginal bleeding and vaginal discharge.  Musculoskeletal: Negative for arthralgias.  Neurological: Negative for dizziness.  Psychiatric/Behavioral: Negative for agitation.  All other systems reviewed and are negative.   Physical Exam Updated Vital Signs BP (!) 118/94   Pulse 78   Temp 98.7 F (37.1 C) (Oral)   Resp 16   Ht 5\' 8"  (1.727 m)   Wt 99.8 kg   SpO2 100%   BMI 33.45 kg/m   Physical Exam Vitals and nursing note reviewed.  Constitutional:      General: He is not in acute distress.    Appearance: He is normal weight.  HENT:     Head: Normocephalic and atraumatic.     Nose: Nose normal.  Eyes:     Conjunctiva/sclera: Conjunctivae normal.     Pupils: Pupils are equal, round, and reactive to light.  Cardiovascular:     Rate and Rhythm: Normal rate and regular rhythm.     Pulses: Normal pulses.     Heart sounds: Normal heart sounds.  Pulmonary:     Effort:  Pulmonary effort is normal.     Breath sounds: Normal breath sounds.  Abdominal:     General: Abdomen is flat. Bowel sounds are normal.     Tenderness: There is no abdominal tenderness. There is no guarding or rebound. Negative signs include Murphy's sign and McBurney's sign.  Musculoskeletal:        General: Normal range of motion.     Cervical back: Normal range of motion and neck supple.  Skin:    General: Skin is warm and dry.     Capillary Refill: Capillary refill takes less than 2 seconds.  Neurological:     General: No focal deficit present.     Mental Status: He is alert and oriented to person, place, and time.  Deep Tendon Reflexes: Reflexes normal.  Psychiatric:        Mood and Affect: Mood normal.        Behavior: Behavior normal.     ED Results / Procedures / Treatments   Labs (all labs ordered are listed, but only abnormal results are displayed) Labs Reviewed - No data to display  EKG None  Radiology DG Abdomen Acute W/Chest  Result Date: 02/08/2019 CLINICAL DATA:  24 year old male with 10 days of epigastric pain, progressive. History of nephrolithiasis. EXAM: DG ABDOMEN ACUTE W/ 1V CHEST COMPARISON:  CT Abdomen and Pelvis 07/26/2017. Abdominal series 01/16/2008. FINDINGS: Lung volumes and mediastinal contours are within normal limits. Visualized tracheal air column is within normal limits. Lung markings appear stable since 2010. No convincing abnormal pulmonary opacity. No pneumothorax or pneumoperitoneum. Non obstructed bowel gas pattern. No urinary calculus is evident radiographically. Abdominal and pelvic visceral contours are within normal limits. No osseous abnormality identified. IMPRESSION: 1. Normal bowel gas pattern, no free air. 2. Negative chest. 3. No urinary calculus is evident radiographically. Electronically Signed   By: Genevie Ann M.D.   On: 02/08/2019 05:51    Procedures Procedures (including critical care time)  Medications Ordered in  ED Medications  sucralfate (CARAFATE) 1 GM/10ML suspension 1 g (1 g Oral Given 02/08/19 0543)  alum & mag hydroxide-simeth (MAALOX/MYLANTA) 200-200-20 MG/5ML suspension 30 mL (30 mLs Oral Given 02/08/19 0458)    And  lidocaine (XYLOCAINE) 2 % viscous mouth solution 15 mL (15 mLs Oral Given 02/08/19 0458)    ED Course  I have reviewed the triage vital signs and the nursing notes.  Pertinent labs & imaging results that were available during my care of the patient were reviewed by me and considered in my medical decision making (see chart for details).    Markedly better post med, will start prilosec and gerd friendly diet.  Follow up with GI as an outpatient.  Stanley Henry was evaluated in Emergency Department on 02/08/2019 for the symptoms described in the history of present illness. He was evaluated in the context of the global COVID-19 pandemic, which necessitated consideration that the patient might be at risk for infection with the SARS-CoV-2 virus that causes COVID-19. Institutional protocols and algorithms that pertain to the evaluation of patients at risk for COVID-19 are in a state of rapid change based on information released by regulatory bodies including the CDC and federal and state organizations. These policies and algorithms were followed during the patient's care in the ED.  Final Clinical Impression(s) / ED Diagnoses Final diagnoses:  Gastroesophageal reflux disease, unspecified whether esophagitis present  Return for weakness, numbness, changes in vision or speech, fevers >100.4 unrelieved by medication, shortness of breath, intractable vomiting, or diarrhea, abdominal pain, Inability to tolerate liquids or food, cough, altered mental status or any concerns. No signs of systemic illness or infection. The patient is nontoxic-appearing on exam and vital signs are within normal limits.   I have reviewed the triage vital signs and the nursing notes. Pertinent labs &imaging results  that were available during my care of the patient were reviewed by me and considered in my medical decision making (see chart for details).  After history, exam, and medical workup I feel the patient has been appropriately medically screened and is safe for discharge home. Pertinent diagnoses were discussed with the patient. Patient was given return precautions  Rx / DC Orders ED Discharge Orders         Ordered  omeprazole (PRILOSEC) 20 MG capsule  Daily     02/08/19 0542           Dijuan Sleeth, MD 02/08/19 3154    Nicanor Alcon, Bryant Lipps, MD 02/08/19 0086

## 2019-02-08 NOTE — ED Triage Notes (Signed)
  Patient comes in with epigastric pain that has been going on for about 10 days.  Patient states he has had bad reflux and seen his PCP but feels like it is getting worse.  Patient was seen yesterday morning and given prescription for ranitidine but was told they did not have any at that time at his pharmacy.  Patient states he has been eating a BRAT diet but still has bad reflux and feels hungry.  Took pepcid at 0200 with no relief.  Pain 6/10.

## 2019-04-04 ENCOUNTER — Other Ambulatory Visit: Payer: Self-pay

## 2019-04-04 ENCOUNTER — Emergency Department (HOSPITAL_BASED_OUTPATIENT_CLINIC_OR_DEPARTMENT_OTHER): Payer: 59

## 2019-04-04 ENCOUNTER — Emergency Department (HOSPITAL_BASED_OUTPATIENT_CLINIC_OR_DEPARTMENT_OTHER)
Admission: EM | Admit: 2019-04-04 | Discharge: 2019-04-04 | Disposition: A | Discharge status: Home / Self Care | Payer: 59 | Attending: Emergency Medicine | Admitting: Emergency Medicine

## 2019-04-04 ENCOUNTER — Encounter (HOSPITAL_BASED_OUTPATIENT_CLINIC_OR_DEPARTMENT_OTHER): Payer: Self-pay | Admitting: Emergency Medicine

## 2019-04-04 DIAGNOSIS — R1013 Epigastric pain: Secondary | ICD-10-CM

## 2019-04-04 DIAGNOSIS — Z885 Allergy status to narcotic agent status: Secondary | ICD-10-CM | POA: Insufficient documentation

## 2019-04-04 DIAGNOSIS — Z888 Allergy status to other drugs, medicaments and biological substances status: Secondary | ICD-10-CM | POA: Insufficient documentation

## 2019-04-04 DIAGNOSIS — J45909 Unspecified asthma, uncomplicated: Secondary | ICD-10-CM | POA: Insufficient documentation

## 2019-04-04 DIAGNOSIS — R945 Abnormal results of liver function studies: Secondary | ICD-10-CM | POA: Diagnosis not present

## 2019-04-04 DIAGNOSIS — Z720 Tobacco use: Secondary | ICD-10-CM | POA: Insufficient documentation

## 2019-04-04 DIAGNOSIS — R110 Nausea: Secondary | ICD-10-CM

## 2019-04-04 DIAGNOSIS — Z79899 Other long term (current) drug therapy: Secondary | ICD-10-CM | POA: Insufficient documentation

## 2019-04-04 DIAGNOSIS — K760 Fatty (change of) liver, not elsewhere classified: Secondary | ICD-10-CM | POA: Insufficient documentation

## 2019-04-04 DIAGNOSIS — R7989 Other specified abnormal findings of blood chemistry: Secondary | ICD-10-CM

## 2019-04-04 DIAGNOSIS — R1010 Upper abdominal pain, unspecified: Secondary | ICD-10-CM

## 2019-04-04 LAB — COMPREHENSIVE METABOLIC PANEL
ALT: 108 U/L — ABNORMAL HIGH (ref 0–44)
AST: 57 U/L — ABNORMAL HIGH (ref 15–41)
Albumin: 4.5 g/dL (ref 3.5–5.0)
Alkaline Phosphatase: 59 U/L (ref 38–126)
Anion gap: 9 (ref 5–15)
BUN: 6 mg/dL (ref 6–20)
CO2: 24 mmol/L (ref 22–32)
Calcium: 9.7 mg/dL (ref 8.9–10.3)
Chloride: 107 mmol/L (ref 98–111)
Creatinine, Ser: 0.7 mg/dL (ref 0.61–1.24)
GFR calc Af Amer: >60 mL/min (ref >60)
GFR calc non Af Amer: >60 mL/min (ref >60)
Glucose, Bld: 92 mg/dL (ref 70–99)
Potassium: 4.1 mmol/L (ref 3.5–5.1)
Sodium: 140 mmol/L (ref 135–145)
Total Bilirubin: 0.7 mg/dL (ref 0.3–1.2)
Total Protein: 6.8 g/dL (ref 6.5–8.1)

## 2019-04-04 LAB — CBC WITH DIFFERENTIAL/PLATELET
Abs Immature Granulocytes: 0.01 10*3/uL (ref 0.00–0.07)
Basophils Absolute: 0.1 10*3/uL (ref 0.0–0.1)
Basophils Relative: 1 %
Eosinophils Absolute: 0.4 10*3/uL (ref 0.0–0.5)
Eosinophils Relative: 5 %
HCT: 40.3 % (ref 39.0–52.0)
Hemoglobin: 13.7 g/dL (ref 13.0–17.0)
Immature Granulocytes: 0 %
Lymphocytes Relative: 41 %
Lymphs Abs: 3.1 10*3/uL (ref 0.7–4.0)
MCH: 30.4 pg (ref 26.0–34.0)
MCHC: 34 g/dL (ref 30.0–36.0)
MCV: 89.6 fL (ref 80.0–100.0)
Monocytes Absolute: 0.7 10*3/uL (ref 0.1–1.0)
Monocytes Relative: 9 %
Neutro Abs: 3.4 10*3/uL (ref 1.7–7.7)
Neutrophils Relative %: 44 %
Platelets: 281 10*3/uL (ref 150–400)
RBC: 4.5 MIL/uL (ref 4.22–5.81)
RDW: 12.2 % (ref 11.5–15.5)
WBC: 7.6 10*3/uL (ref 4.0–10.5)
nRBC: 0 % (ref 0.0–0.2)

## 2019-04-04 LAB — URINALYSIS, ROUTINE W REFLEX MICROSCOPIC
Bilirubin Urine: NEGATIVE
Glucose, UA: NEGATIVE mg/dL
Hgb urine dipstick: NEGATIVE
Ketones, ur: NEGATIVE mg/dL
Leukocytes,Ua: NEGATIVE
Nitrite: NEGATIVE
Protein, ur: NEGATIVE mg/dL
Specific Gravity, Urine: 1.025 (ref 1.005–1.030)
pH: 7.5 (ref 5.0–8.0)

## 2019-04-04 LAB — LIPASE, BLOOD: Lipase: 28 U/L (ref 11–51)

## 2019-04-04 MED ORDER — DROPERIDOL 2.5 MG/ML IJ SOLN
1.2500 mg | Freq: Once | INTRAMUSCULAR | Status: AC
  Administered 2019-04-04: 1.25 mg via INTRAVENOUS
  Filled 2019-04-04: qty 2

## 2019-04-04 MED ORDER — ONDANSETRON 4 MG PO TBDP: 4.0000 mg | ORAL_TABLET | Freq: Three times a day (TID) | ORAL | 0 refills | Status: AC | PRN

## 2019-04-04 MED ORDER — SUCRALFATE 1 GM/10ML PO SUSP
ORAL | Status: AC
  Filled 2019-04-04: qty 10

## 2019-04-04 MED ORDER — OMEPRAZOLE 20 MG PO CPDR: 20.0000 mg | DELAYED_RELEASE_CAPSULE | Freq: Every day | ORAL | 0 refills | Status: AC

## 2019-04-04 MED ORDER — SUCRALFATE 1 G PO TABS: 1.0000 g | ORAL_TABLET | Freq: Three times a day (TID) | ORAL | 0 refills | Status: AC

## 2019-04-04 MED ORDER — SUCRALFATE 1 G PO TABS
1.0000 g | ORAL_TABLET | Freq: Once | ORAL | Status: AC
  Administered 2019-04-04: 1 g via ORAL
  Filled 2019-04-04: qty 1

## 2019-04-04 MED ORDER — FENTANYL CITRATE (PF) 100 MCG/2ML IJ SOLN
50.0000 ug | Freq: Once | INTRAMUSCULAR | Status: DC
  Filled 2019-04-04: qty 2

## 2019-04-04 MED ORDER — METOCLOPRAMIDE HCL 5 MG/ML IJ SOLN
10.0000 mg | Freq: Once | INTRAMUSCULAR | Status: AC
  Administered 2019-04-04: 10 mg via INTRAVENOUS
  Filled 2019-04-04: qty 2

## 2019-04-04 MED ORDER — LIDOCAINE VISCOUS HCL 2 % MT SOLN
15.0000 mL | Freq: Once | OROMUCOSAL | Status: AC
  Administered 2019-04-04: 15 mL via ORAL
  Filled 2019-04-04: qty 15

## 2019-04-04 MED ORDER — HYDROMORPHONE HCL 1 MG/ML IJ SOLN
1.0000 mg | Freq: Once | INTRAMUSCULAR | Status: AC
  Administered 2019-04-04: 1 mg via INTRAVENOUS
  Filled 2019-04-04: qty 1

## 2019-04-04 MED ORDER — PANTOPRAZOLE SODIUM 40 MG IV SOLR
40.0000 mg | Freq: Once | INTRAVENOUS | Status: AC
  Administered 2019-04-04: 40 mg via INTRAVENOUS
  Filled 2019-04-04: qty 40

## 2019-04-04 MED ORDER — ALUM & MAG HYDROXIDE-SIMETH 200-200-20 MG/5ML PO SUSP
30.0000 mL | Freq: Once | ORAL | Status: AC
  Administered 2019-04-04: 30 mL via ORAL
  Filled 2019-04-04: qty 30

## 2019-04-04 NOTE — ED Notes (Signed)
PT moaning in pain , from epigastric area.  No emesis since arrival. VSS. Pt states now has a ride home and they are in the parking lot. Provider informed. Vss.

## 2019-04-04 NOTE — Discharge Instructions (Addendum)
You were seen in the emergency department today for abdominal pain and vomiting.  Your work-up was overall reassuring.  Your liver function tests were mildly elevated and your ultrasound showed some changes of fatty liver.  Please discuss this with your primary provider or your GI doctor for follow-up.  The rest of your labs and ultrasound did not show any significant abnormalities.  We are sending you home with Zofran to take every 8 hours as needed for nausea and vomiting as well as Carafate to take prior to meals and prior to bedtime as well as omeprazole to take daily to help with stomach acidity.  We have prescribed you new medication(s) today. Discuss the medications prescribed today with your pharmacist as they can have adverse effects and interactions with your other medicines including over the counter and prescribed medications. Seek medical evaluation if you start to experience new or abnormal symptoms after taking one of these medicines, seek care immediately if you start to experience difficulty breathing, feeling of your throat closing, facial swelling, or rash as these could be indications of a more serious allergic reaction  Please follow attached diet guidelines.  Follow-up with your primary care provider, your GI doctor, or the additional GI doctor provided in your discharge instructions within 3 days.  Return to the emergency department for new or worsening symptoms including but not limited to increased pain, inability to keep fluids down, fever, blood in your vomit, blood in your stool, or any other concerns.

## 2019-04-04 NOTE — ED Notes (Signed)
PT's ride in the room at time of discharge. Pt alert, pain decreased. No emesis. VSS.

## 2019-04-04 NOTE — ED Provider Notes (Signed)
18:00: Assumed care of patient from Fayrene Helper PA-C at change of shift pending [place], re-assessment & disposition.   Please see prior provider note for full H&P.  Briefly patient is a 24 yo male who presented ot the ED with complaints of epigastric pain with N/V today. Hx of similar in the past.   Physical Exam  BP 126/79 (BP Location: Left Arm)   Pulse 87   Temp 98.1 F (36.7 C) (Oral)   Resp 18   Ht 5\' 9"  (1.753 m)   Wt 100.2 kg   SpO2 100%   BMI 32.64 kg/m   Physical Exam Vitals and nursing note reviewed.  Constitutional:      General: He is not in acute distress.    Appearance: He is well-developed.  HENT:     Head: Normocephalic and atraumatic.  Eyes:     General:        Right eye: No discharge.        Left eye: No discharge.     Conjunctiva/sclera: Conjunctivae normal.  Abdominal:     Palpations: Abdomen is soft.     Tenderness: There is no abdominal tenderness. There is no right CVA tenderness, left CVA tenderness, guarding or rebound. Negative signs include Murphy's sign.  Neurological:     Mental Status: He is alert.     Comments: Clear speech.   Psychiatric:        Behavior: Behavior normal.        Thought Content: Thought content normal.     ED Course/Procedures   Results for orders placed or performed during the hospital encounter of 04/04/19  CBC with Differential  Result Value Ref Range   WBC 7.6 4.0 - 10.5 K/uL   RBC 4.50 4.22 - 5.81 MIL/uL   Hemoglobin 13.7 13.0 - 17.0 g/dL   HCT 06/04/19 94.8 - 54.6 %   MCV 89.6 80.0 - 100.0 fL   MCH 30.4 26.0 - 34.0 pg   MCHC 34.0 30.0 - 36.0 g/dL   RDW 27.0 35.0 - 09.3 %   Platelets 281 150 - 400 K/uL   nRBC 0.0 0.0 - 0.2 %   Neutrophils Relative % 44 %   Neutro Abs 3.4 1.7 - 7.7 K/uL   Lymphocytes Relative 41 %   Lymphs Abs 3.1 0.7 - 4.0 K/uL   Monocytes Relative 9 %   Monocytes Absolute 0.7 0.1 - 1.0 K/uL   Eosinophils Relative 5 %   Eosinophils Absolute 0.4 0.0 - 0.5 K/uL   Basophils Relative 1 %   Basophils Absolute 0.1 0.0 - 0.1 K/uL   Immature Granulocytes 0 %   Abs Immature Granulocytes 0.01 0.00 - 0.07 K/uL  Comprehensive metabolic panel  Result Value Ref Range   Sodium 140 135 - 145 mmol/L   Potassium 4.1 3.5 - 5.1 mmol/L   Chloride 107 98 - 111 mmol/L   CO2 24 22 - 32 mmol/L   Glucose, Bld 92 70 - 99 mg/dL   BUN 6 6 - 20 mg/dL   Creatinine, Ser 81.8 0.61 - 1.24 mg/dL   Calcium 9.7 8.9 - 2.99 mg/dL   Total Protein 6.8 6.5 - 8.1 g/dL   Albumin 4.5 3.5 - 5.0 g/dL   AST 57 (H) 15 - 41 U/L   ALT 108 (H) 0 - 44 U/L   Alkaline Phosphatase 59 38 - 126 U/L   Total Bilirubin 0.7 0.3 - 1.2 mg/dL   GFR calc non Af Amer >60 >60 mL/min  GFR calc Af Amer >60 >60 mL/min   Anion gap 9 5 - 15  Lipase, blood  Result Value Ref Range   Lipase 28 11 - 51 U/L  Urinalysis, Routine w reflex microscopic  Result Value Ref Range   Color, Urine YELLOW YELLOW   APPearance CLEAR CLEAR   Specific Gravity, Urine 1.025 1.005 - 1.030   pH 7.5 5.0 - 8.0   Glucose, UA NEGATIVE NEGATIVE mg/dL   Hgb urine dipstick NEGATIVE NEGATIVE   Bilirubin Urine NEGATIVE NEGATIVE   Ketones, ur NEGATIVE NEGATIVE mg/dL   Protein, ur NEGATIVE NEGATIVE mg/dL   Nitrite NEGATIVE NEGATIVE   Leukocytes,Ua NEGATIVE NEGATIVE   US Abdomen Limited RUQ  Result Date: 04/04/2019 CLINICAL DATA:  Nausea for 1 day, upper abdominal pain EXAM: ULTRASOUND ABDOMEN LIMITED RIGHT UPPER QUADRANT COMPARISON:  08/08/2017, 12/01/2015 FINDINGS: Gallbladder: No gallstones or wall thickening visualized. No sonographic Murphy sign noted by sonographer. Common bile duct: Diameter: 4 mm Liver: Diffuse increased liver echotexture consistent with steatosis. No focal abnormality. Portal vein is patent on color Doppler imaging with normal direction of blood flow towards the liver. Other: None. IMPRESSION: 1. Hepatic steatosis. 2. Otherwise unremarkable exam. Electronically Signed   By: Randa Ngo M.D.   On: 04/04/2019 19:02    Procedures  MDM   Work-up reviewed: CBC: No anemia or leukocytosis CMP: Mildly elevated LFTs, T bili WNL. Lipase: WNL UA: No infection Ultrasound: Hepatic steatosis, otherwise unremarkable exam.  19:20: RE-EVAL: [name] abdomen is completely nontender without peritoneal signs.  States he still having some abdominal discomfort and nausea, but has not vomited, he would like to try to drink ginger ale.  Following p.o. challenge patient without emesis, however he reported continued discomfort, trial of Protonix without much change.  Ultimately he was able to find a ride home therefore droperidol given and subsequently Dilaudid.  Patient pain-free, tolerating p.o., & remains without peritoneal signs on repeat abdominal exams- do not suspect acute surgical process such as cholecystitis, appendicitis, perf, or obstruction. No reports of blood in emesis/stool, stable hgb/hct and normal BUN- dot not suspect significant upper GI bleed. He appears appropriate for discharge with supportive care and follow up with his GI doctor. I discussed results, treatment plan, need for follow-up, and return precautions with the patient. Provided opportunity for questions, patient confirmed understanding and is in agreement with plan.   Findings and plan of care discussed with supervising physician Dr. Roslynn Amble who is in agreement.       Leafy Kindle 04/04/19 2208    Lucrezia Starch, MD 04/08/19 [phone]

## 2019-04-04 NOTE — ED Notes (Signed)
PT without emesis since arrival. Skin warm and dry. VSS. Tolerated po fluids.

## 2019-04-04 NOTE — ED Notes (Signed)
Pt. Stated he was nauseous, but didn't wanna throw up in the blue emesis bag. I asked why and no answer given. Given a trash can instead, he stated "that he didn't want me to worry about his nausea."

## 2019-04-04 NOTE — ED Triage Notes (Signed)
Upper abd pain with vomiting since last night.  

## 2019-04-04 NOTE — ED Provider Notes (Signed)
[facility] Provider Note   CSN: [id number] Arrival date & time: 04/04/19  1557     History Chief Complaint  Patient presents with  . Abdominal Pain    Stanley Henry is a 24 y.o. male.  The history is provided by the patient and medical records. No language interpreter was used.  Abdominal Pain    24 year old male with history of alcohol abuse, borderline personality disorder, kidney stones, presenting for evaluation of abdominal pain.  Patient report he woke up this morning with pain to his epigastric region.  He described pain as a dull achy sensation with radiation to his mid chest towards his throat.  He endorsed nausea, vomiting multiple episodes of nonbloody nonbilious content.  He report normal bowel movement.  Pain is currently 6 out of 10.  No associated fever chills no lightheadedness no chest pain shortness of breath productive cough dysuria hematuria hematochezia or melena.  He has had 1 similar instance of pain proximally a month ago and was seen in the ED.  States an ultrasound was done as well as x-ray and no definitive diagnosis were made.  He has been evaluated by GI specialist recently and was told that he is not gluten intolerance.  He denies any recent alcohol use.  He denies any Covid symptoms.  He did report improvement of his symptoms with GI cocktail during the last visit.   Past Medical History:  Diagnosis Date  . Alcohol abuse   . Anxiety   . Asthma   . Borderline personality disorder ([place])   . Depression   . Fatty liver   . History of kidney stones   . Kidney Burch     Patient Active Problem List   Diagnosis Date Noted  . MDD (major depressive disorder), recurrent episode, moderate ([place]) 02/18/2013  . GAD (generalized anxiety disorder) 06/04/2012    Past Surgical History:  Procedure Laterality Date  . KIDNEY [place] SURGERY    . WISDOM TOOTH EXTRACTION         No family history on file.  Social History    Tobacco Use  . Smoking status: Current Some Day Smoker    Types: Cigarettes  . Smokeless tobacco: Never Used  Substance Use Topics  . Alcohol use: Yes  . Drug use: Yes    Types: Marijuana    Home Medications Prior to Admission medications   Medication Sig Start Date End Date Taking? Authorizing Provider  albuterol (PROVENTIL HFA;VENTOLIN HFA) 108 (90 BASE) MCG/ACT inhaler Inhale 2 puffs into the lungs every 6 (six) hours as needed for shortness of breath. Patient may resume home supply. 02/20/13   Winson, Manus Rudd, NP  citalopram (CELEXA) 20 MG tablet Take 1 tablet (20 mg total) by mouth daily. 02/20/13   Winson, Manus Rudd, NP  emtricitabine-tenofovir (TRUVADA) 200-300 MG tablet Take 1 tablet by mouth daily.    [provider]  fexofenadine (ALLEGRA) 180 MG tablet Take 1 tablet (180 mg total) by mouth every morning. Patient may resume home supply. 02/20/13   Winson, Manus Rudd, NP  ibuprofen (ADVIL,MOTRIN) 400 MG tablet Take 1 tablet (400 mg total) by mouth every 8 (eight) hours as needed. 12/01/15   Jola Schmidt, MD  ISOtretinoin (ACCUTANE) 30 MG capsule Take 30 mg by mouth 2 (two) times daily.    [provider]  ketorolac (TORADOL) 10 MG tablet Take 1 tablet (10 mg total) by mouth every 6 (six) hours as needed. 07/28/17   Margarita Mail,  PA-C  lamoTRIgine (LAMICTAL) 100 MG tablet Take 1 tablet (100 mg total) by mouth daily. 02/20/13   Winson, Manus Rudd, NP  omeprazole (PRILOSEC) 20 MG capsule Take 1 capsule (20 mg total) by mouth daily. 02/08/19   Palumbo, April, MD  ondansetron (ZOFRAN ODT) 8 MG disintegrating tablet Take 1 tablet (8 mg total) by mouth every 8 (eight) hours as needed for nausea or vomiting. 12/01/15   Jola Schmidt, MD  ondansetron (ZOFRAN) 4 MG tablet Take 1 tablet (4 mg total) by mouth every 8 (eight) hours as needed for nausea or vomiting. 07/28/17   Margarita Mail, PA-C  Oxycodone HCl 10 MG TABS Take 0.5-1 tablets (5-10 mg total) by mouth every 6 (six) hours as  needed for severe pain. 07/28/17   Margarita Mail, PA-C  phenazopyridine (PYRIDIUM) 200 MG tablet Take 1 tablet (200 mg total) by mouth 3 (three) times daily as needed for pain. 12/10/15   Ardis Hughs, MD  risperiDONE (RISPERDAL) 0.5 MG tablet Take 1 tablet (0.5 mg total) by mouth 2 (two) times daily. 02/20/13   Winson, Manus Rudd, NP  tamsulosin (FLOMAX) 0.4 MG CAPS capsule Take 1 capsule (0.4 mg total) by mouth daily. 12/01/15   Jola Schmidt, MD    Allergies    Phenergan [promethazine hcl] and Hydrocodone  Review of Systems   Review of Systems  Gastrointestinal: Positive for abdominal pain.  All other systems reviewed and are negative.   Physical Exam Updated Vital Signs BP 126/79 (BP Location: Left Arm)   Pulse 87   Temp 98.1 F (36.7 C) (Oral)   Resp 18   Ht _0  (1.753 m)   Wt 100.2 kg   SpO2 100%   BMI 32.64 kg/m   Physical Exam Vitals and nursing note reviewed.  Constitutional:      General: He is not in acute distress.    Appearance: He is well-developed. He is obese.  HENT:     Head: Atraumatic.  Eyes:     Conjunctiva/sclera: Conjunctivae normal.  Cardiovascular:     Rate and Rhythm: Normal rate and regular rhythm.  Pulmonary:     Effort: Pulmonary effort is normal.     Breath sounds: Normal breath sounds.  Abdominal:     General: Abdomen is flat.     Palpations: Abdomen is soft.     Tenderness: There is abdominal tenderness (Mild epigastric tenderness on palpation without guarding or rebound tenderness.  Negative Murphy sign, no pain at McBurney's point.) in the epigastric area.  Musculoskeletal:     Cervical back: Neck supple.  Skin:    General: Skin is warm.     Findings: No rash.  Neurological:     Mental Status: He is alert and oriented to person, place, and time.  Psychiatric:        Mood and Affect: Mood normal.     ED Results / Procedures / Treatments   Labs (all labs ordered are listed, but only abnormal results are displayed) Labs  Reviewed  COMPREHENSIVE METABOLIC PANEL - Abnormal; Notable for the following components:      Result Value   AST 57 (*)    ALT 108 (*)    All other components within normal limits  CBC WITH DIFFERENTIAL/PLATELET  LIPASE, BLOOD  URINALYSIS, ROUTINE W REFLEX MICROSCOPIC    EKG None  Radiology No results found.  Procedures Procedures (including critical care time)  Medications Ordered in ED Medications  alum & mag hydroxide-simeth (MAALOX/MYLANTA) 200-200-20 MG/5ML suspension 30  mL (30 mLs Oral Given 04/04/19 1634)    And  lidocaine (XYLOCAINE) 2 % viscous mouth solution 15 mL (15 mLs Oral Given 04/04/19 1634)  metoCLOPramide (REGLAN) injection 10 mg (10 mg Intravenous Given 04/04/19 1722)  sucralfate (CARAFATE) tablet 1 g (1 g Oral Given 04/04/19 1721)  sucralfate (CARAFATE) 1 GM/10ML suspension (  Given 04/04/19 1725)    ED Course  I have reviewed the triage vital signs and the nursing notes.  Pertinent labs & imaging results that were available during my care of the patient were reviewed by me and considered in my medical decision making (see chart for details).  Clinical Course as of Apr 04 1747  Thu Apr 04, 2019  1748 GFR, Est African American: >60 [SP]    Clinical Course User Index [SP] Petrucelli, Samantha R, PA-C   MDM Rules/Calculators/A&P                      BP 126/79 (BP Location: Left Arm)   Pulse 87   Temp 98.1 F (36.7 C) (Oral)   Resp 18   Ht _0  (1.753 m)   Wt 100.2 kg   SpO2 100%   BMI 32.64 kg/m   Final Clinical Impression(s) / ED Diagnoses Final diagnoses:  Epigastric pain    Rx / DC Orders ED Discharge Orders    None     4:22 PM Patient here with epigastric discomfort suspicious of gastritis/GERD or PUD.  Examination reveals minimal epigastric tenderness without concerning features.  Patient is well-appearing, afebrile, vital signs stable.  No COVID-19 symptoms.  Check cocktail given, will check labs.  5:05 PM Labs shows mild elevated  transaminitis with AST 57, ALT 108, alk phos 59 and normal total bilirubin of 0.7.  After receiving GI cocktail, patient states it made him more nauseous and didn't help his symptoms.  Will give Reglan for nausea along with carafate.   5:40 PM Pt appears uncomfortable and still endorse nausea.  Pt was given reglan and carafate approximately 10 minutes ago.  Plan to obtain limited abd [place] to assess for pottential biliary disease causing epigastric discomfort.  Pt sign out to oncoming team who will f/u on [place] and reassess.    If [place] negative and patient feels better and tolerates PO, pt may be discharge with carafate and outpt f/u with GI.     Domenic Moras, PA-C 04/04/19 1749    Lucrezia Starch, MD 04/08/19 [phone]

## 2019-04-04 NOTE — ED Notes (Signed)
PT to [place]. Ambulatory. No emesis , still c/o nausea.

## 2020-06-22 IMAGING — CR DG ABDOMEN ACUTE W/ 1V CHEST
3 series · 3 of 3 positions shown · non-contrast
Comparison: CT Abdomen and Pelvis 07/26/2017. Abdominal series
01/16/2008.

CLINICAL DATA: 23-year-old male with 10 days of epigastric pain,
progressive. History of nephrolithiasis.

EXAM:
DG ABDOMEN ACUTE W/ 1V CHEST

[w chest pa]
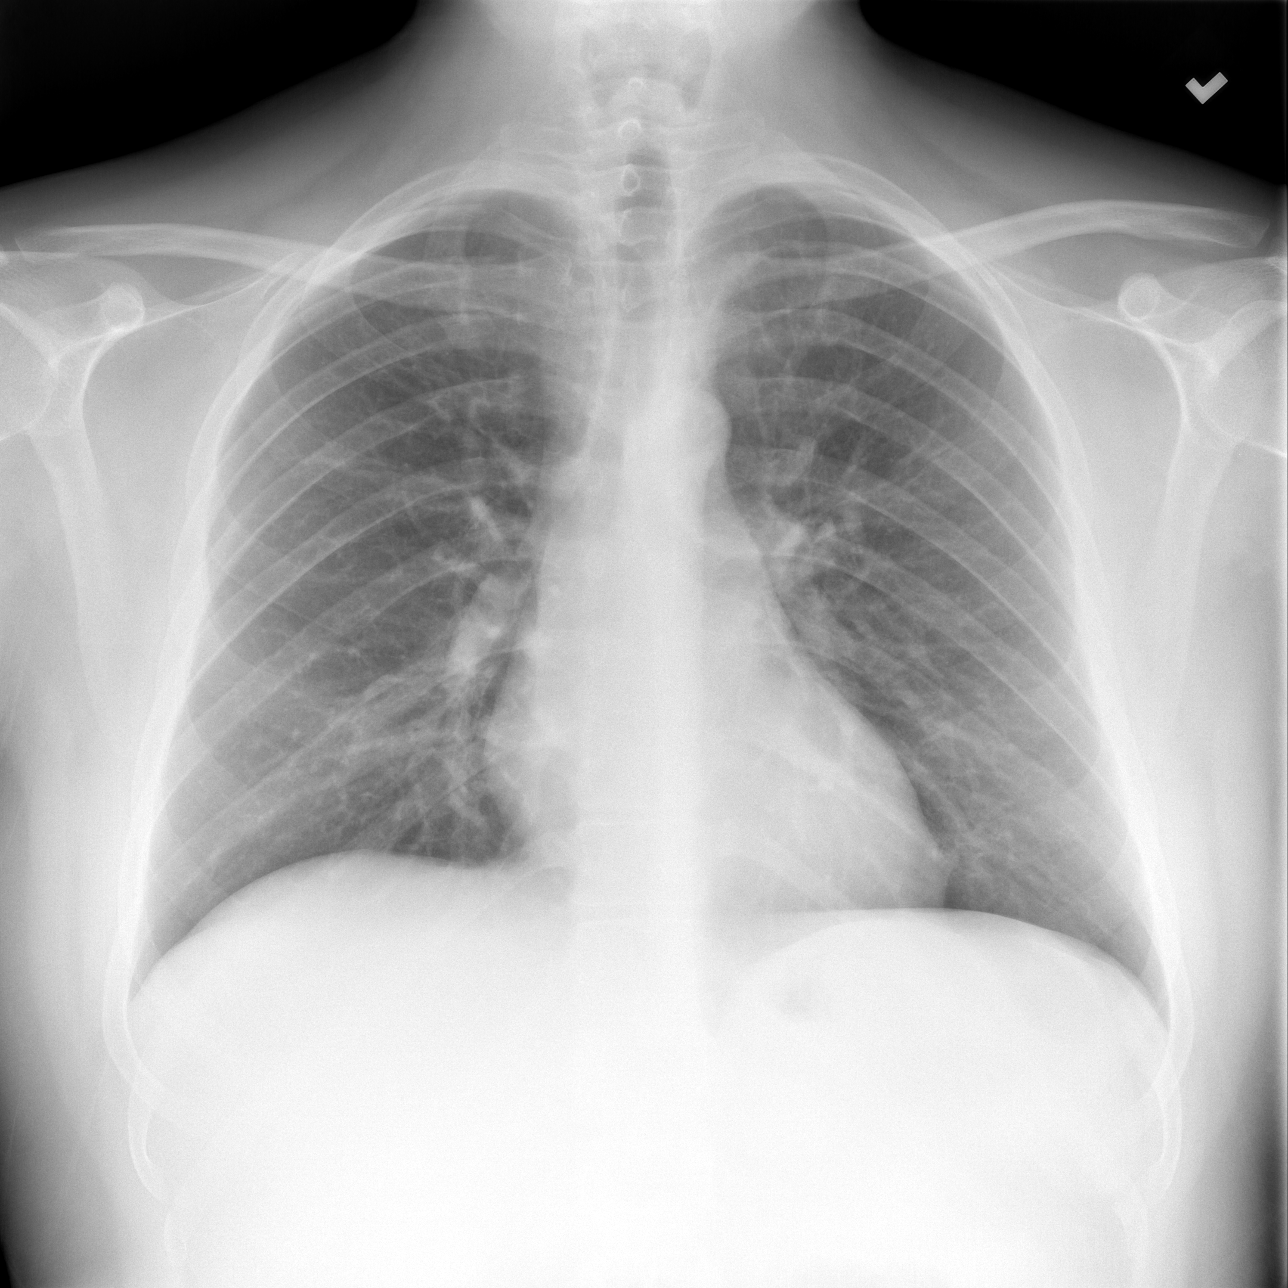

[w abdomen upright]
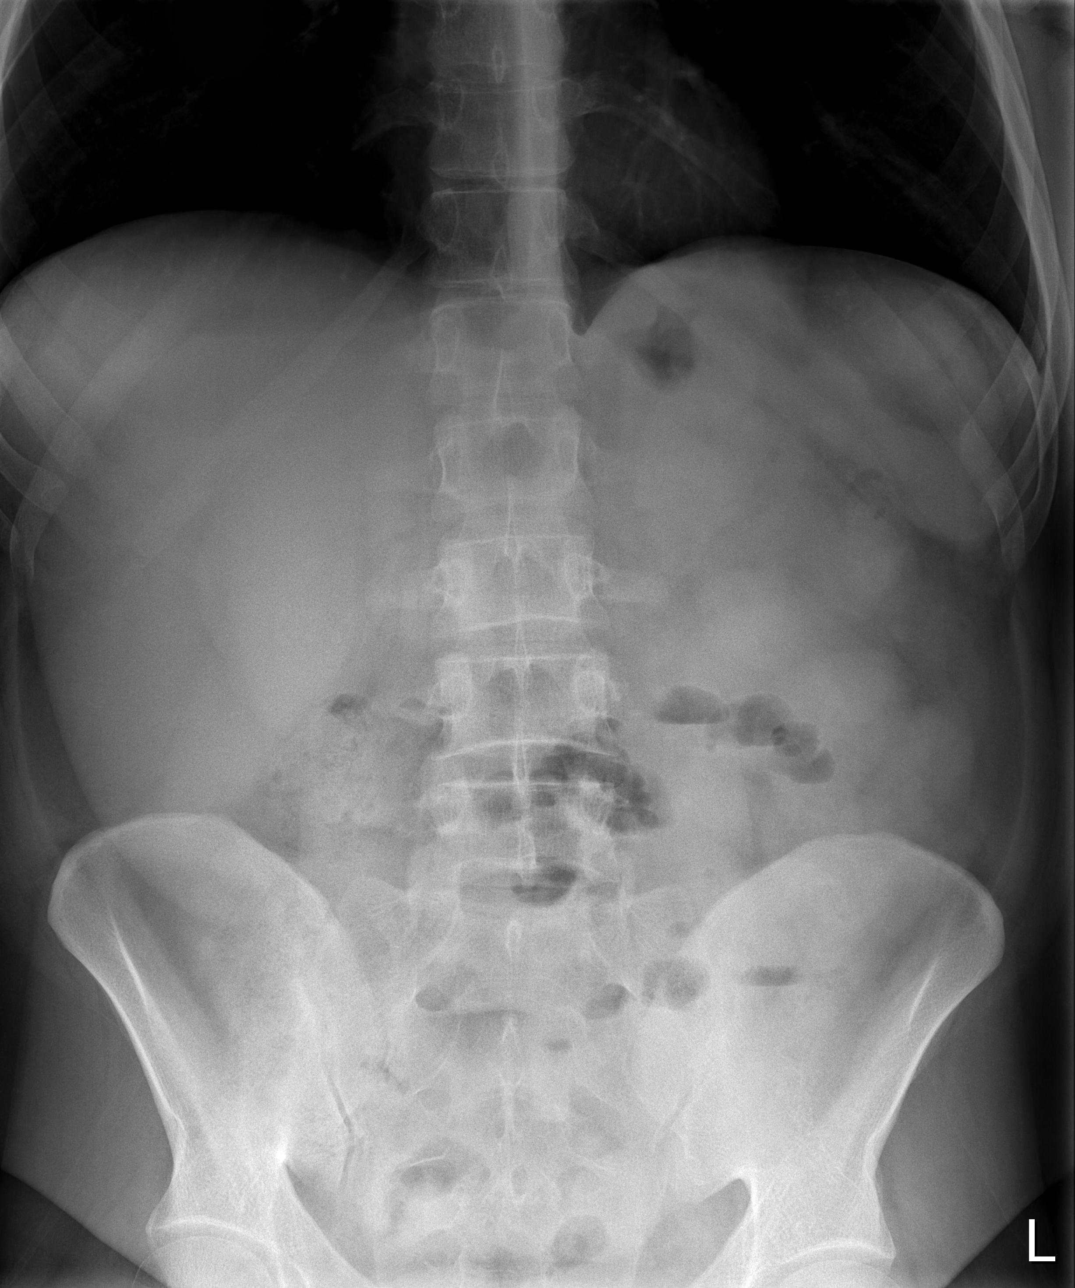

[t abdomen supine]
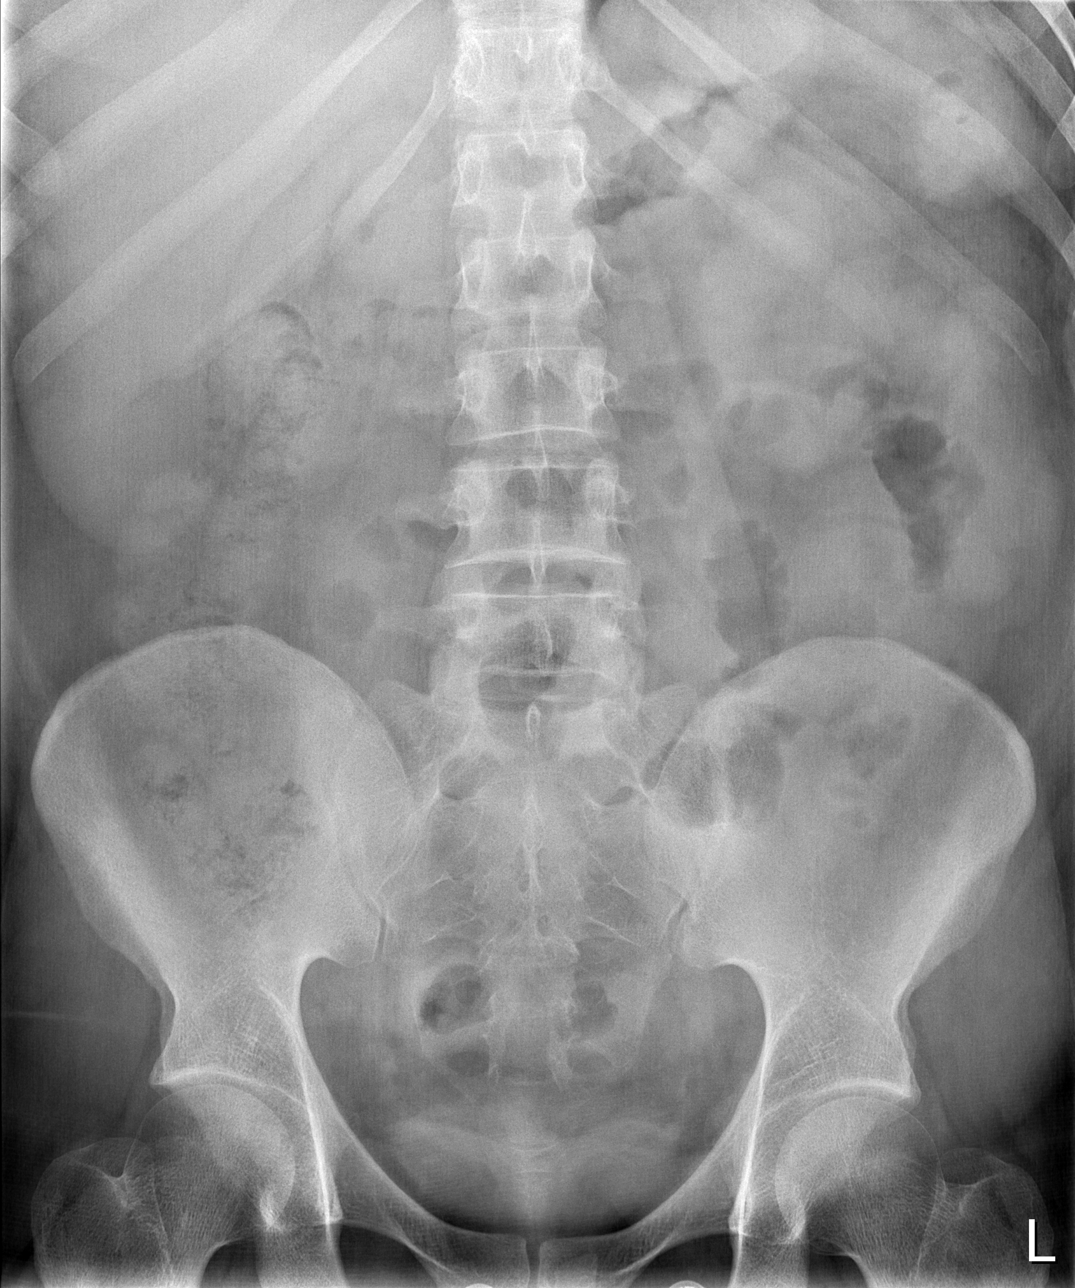

[3 of 3 positions shown; findings below may reference images not displayed]

FINDINGS: Lung volumes and mediastinal contours are within normal limits.
Visualized tracheal air column is within normal limits. Lung
markings appear stable since 1000. No convincing abnormal pulmonary
opacity. No pneumothorax or pneumoperitoneum.

Non obstructed bowel gas pattern. No urinary calculus is evident
radiographically. Abdominal and pelvic visceral contours are within
normal limits. No osseous abnormality identified.
IMPRESSION: 1. Normal bowel gas pattern, no free air.
2. Negative chest.
3. No urinary calculus is evident radiographically.

## 2020-08-16 IMAGING — US US ABDOMEN LIMITED
1 series · 14 of 25 positions shown · non-contrast
Comparison: 08/08/2017, 12/01/2015

CLINICAL DATA: Nausea for 1 day, upper abdominal pain

EXAM:
ULTRASOUND ABDOMEN LIMITED RIGHT UPPER QUADRANT

[Series 1: us abdomen limited · 14 of 39 slices shown]
[im 1/39]
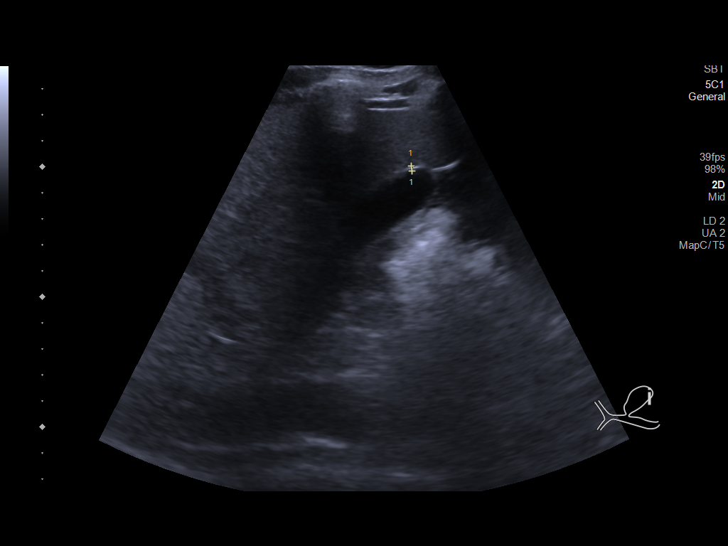
[im 4/39]
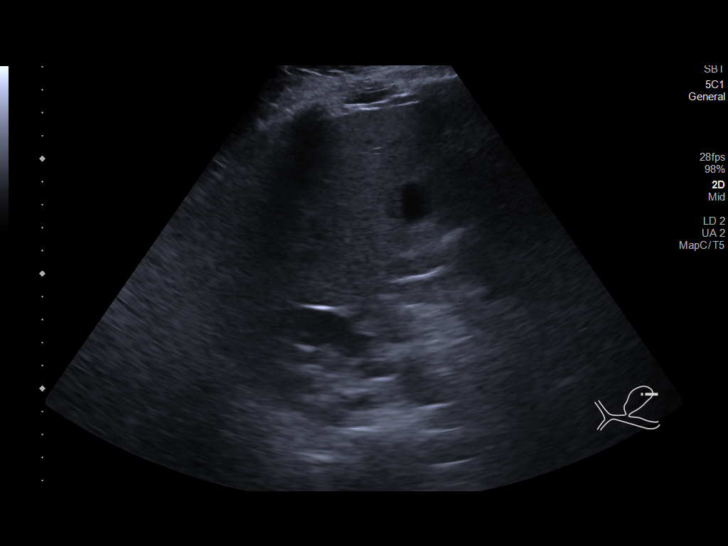
[im 7/39]
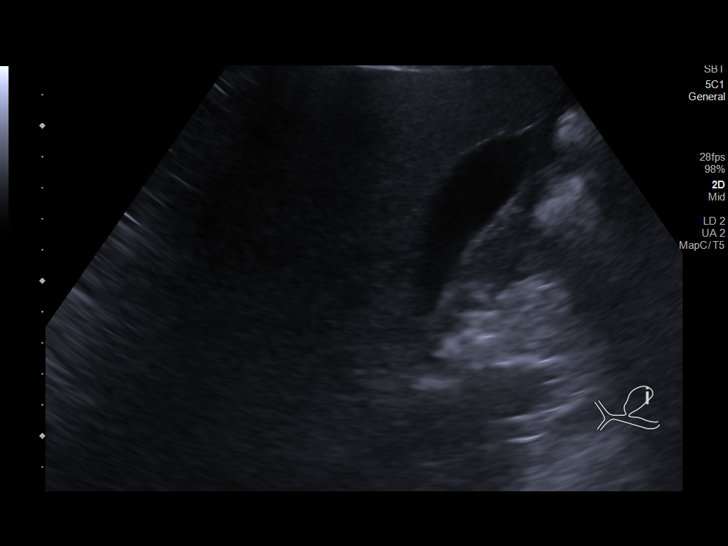
[im 10/39]
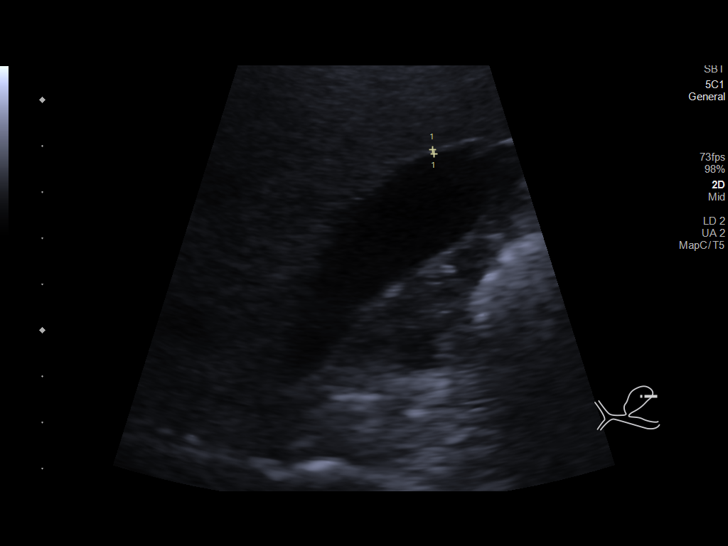
[im 13/39]
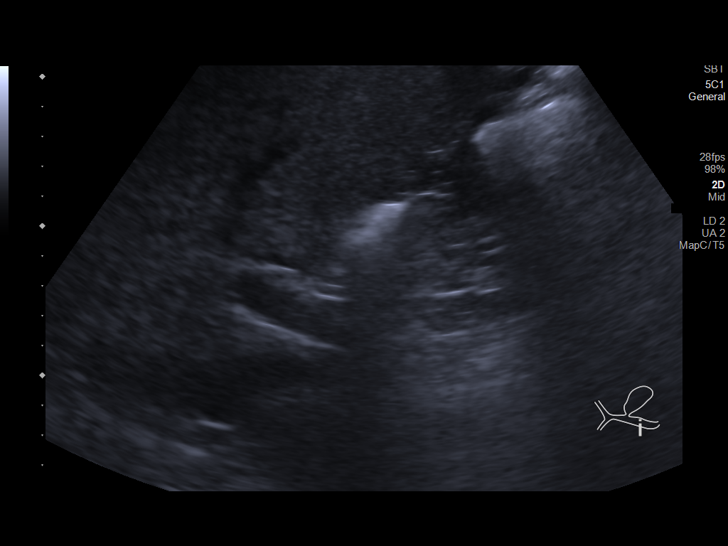
[im 15/39]
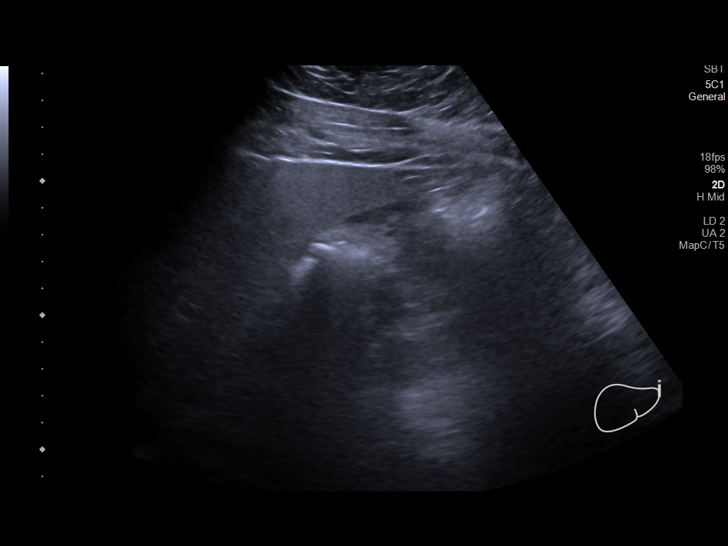
[im 18/39]
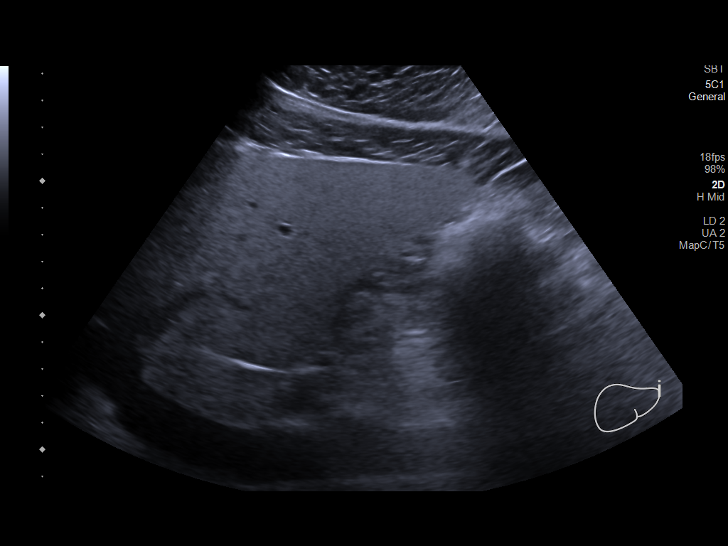
[im 21/39]
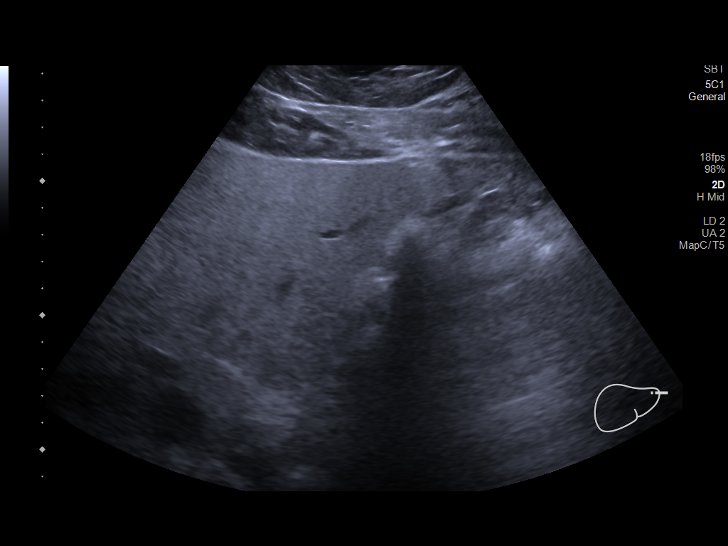
[im 24/39]
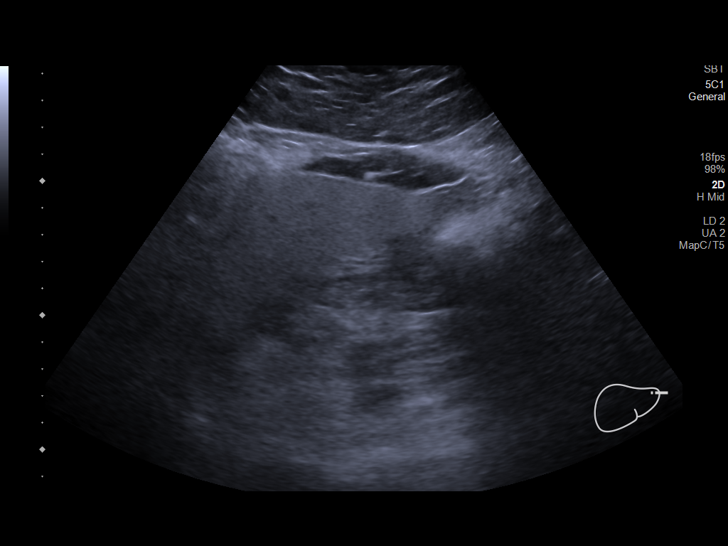
[im 26/39]
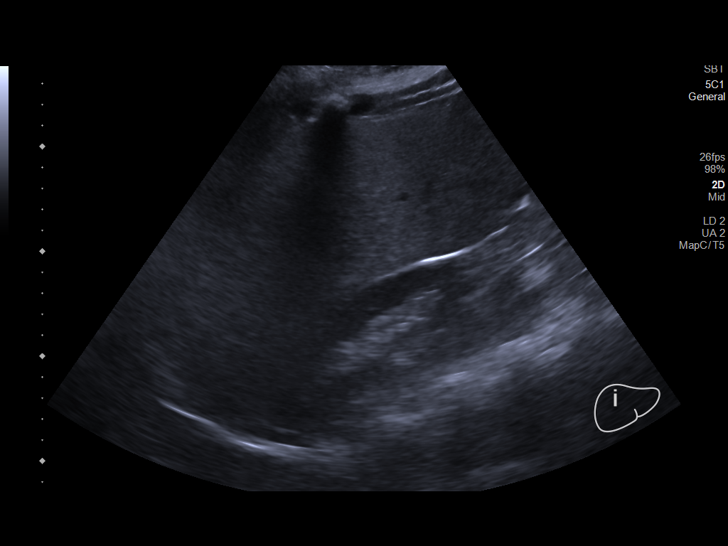
[im 29/39]
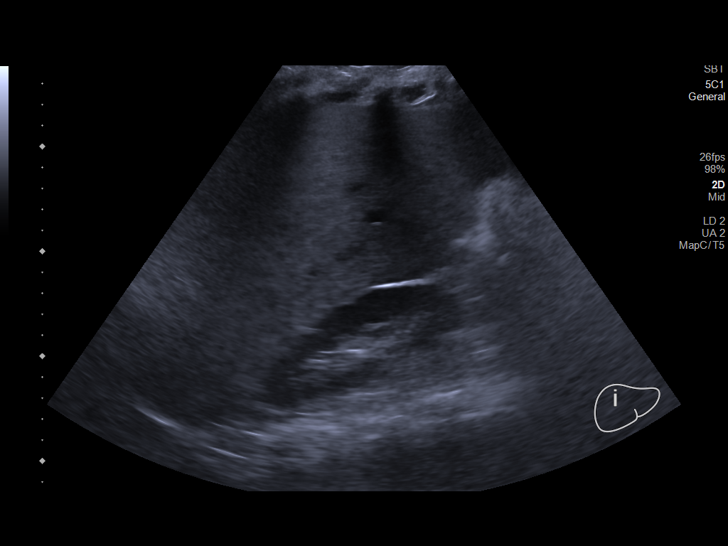
[im 32/39]
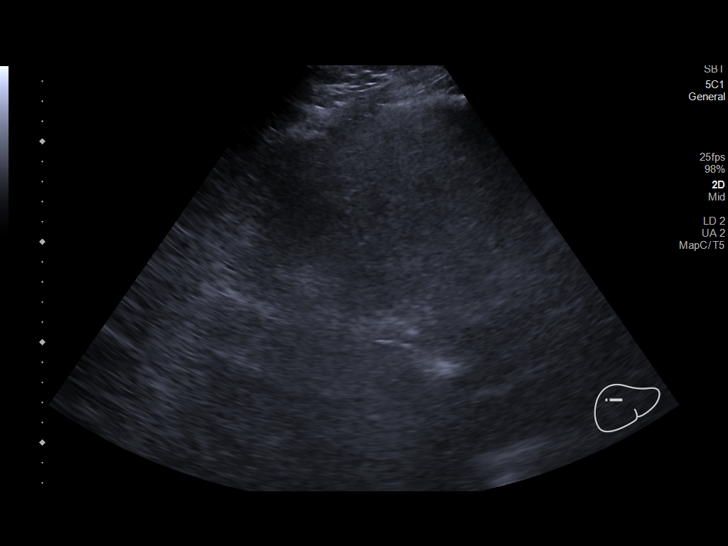
[im 35/39]
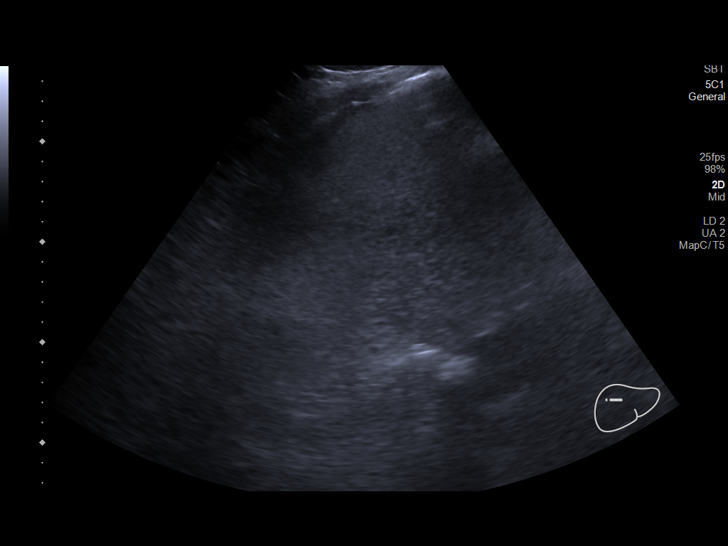
[im 39/39]
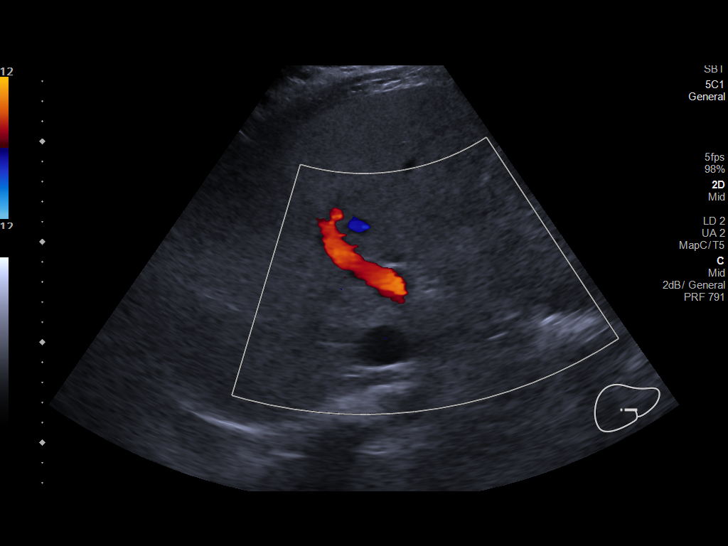

[14 of 25 positions shown; findings below may reference images not displayed]

FINDINGS: Gallbladder:

No gallstones or wall thickening visualized. No sonographic Murphy
sign noted by sonographer.

Common bile duct:

Diameter: 4 mm

Liver:

Diffuse increased liver echotexture consistent with steatosis. No
focal abnormality. Portal vein is patent on color Doppler imaging
with normal direction of blood flow towards the liver.

Other: None.
IMPRESSION: 1. Hepatic steatosis.
2. Otherwise unremarkable exam.
